# Patient Record
Sex: Female | Born: 1980 | Race: White | Marital: Married | Smoking: Former smoker
Health system: Southern US, Community
[De-identification: ages and names within clinical notes are randomized; demographics above are authoritative.]

## PROBLEM LIST (undated history)

## (undated) ENCOUNTER — Inpatient Hospital Stay (HOSPITAL_COMMUNITY): Payer: Self-pay

## (undated) DIAGNOSIS — Z3480 Encounter for supervision of other normal pregnancy, unspecified trimester: Secondary | ICD-10-CM

## (undated) HISTORY — DX: Encounter for supervision of other normal pregnancy, unspecified trimester: Z34.80

---

## 2013-12-14 ENCOUNTER — Other Ambulatory Visit: Payer: Self-pay | Admitting: Family Medicine

## 2013-12-14 ENCOUNTER — Ambulatory Visit (INDEPENDENT_AMBULATORY_CARE_PROVIDER_SITE_OTHER): Payer: Commercial Indemnity | Admitting: Family Medicine

## 2013-12-14 ENCOUNTER — Encounter: Payer: Self-pay | Admitting: Family Medicine

## 2013-12-14 VITALS — BP 102/45 | HR 62 | Ht 62.0 in | Wt 118.5 lb

## 2013-12-14 DIAGNOSIS — Z01419 Encounter for gynecological examination (general) (routine) without abnormal findings: Secondary | ICD-10-CM

## 2013-12-14 DIAGNOSIS — Z30432 Encounter for removal of intrauterine contraceptive device: Secondary | ICD-10-CM

## 2013-12-14 DIAGNOSIS — Z124 Encounter for screening for malignant neoplasm of cervix: Secondary | ICD-10-CM

## 2013-12-14 NOTE — Progress Notes (Signed)
  Subjective:     Theresa Coleman is a 33 y.o. female and is here for a comprehensive physical exam. The patient reports no problems. Has had IUD x 5 years.  Wants it removed.  Interested in becoming pregnant.  History   Social History  . Marital Status: Married    Spouse Name: N/A    Number of Children: N/A  . Years of Education: N/A   Occupational History  . Not on file.   Social History Main Topics  . Smoking status: Former Smoker    Quit date: 10/07/2010  . Smokeless tobacco: Never Used  . Alcohol Use: Yes     Comment: social  . Drug Use: No  . Sexual Activity: Yes    Birth Control/ Protection: IUD   Other Topics Concern  . Not on file   Social History Narrative  . No narrative on file   No health maintenance topics applied.   The following portions of the patient's history were reviewed and updated as appropriate: allergies, current medications, past family history, past medical history, past social history, past surgical history and problem list. Review of Systems A comprehensive review of systems was negative.   Objective:    BP 102/45  Pulse 62  Ht 5\' 2"  (1.575 m)  Wt 118 lb 8 oz (53.751 kg)  BMI 21.67 kg/m2 General appearance: alert, cooperative and appears stated age Head: Normocephalic, without obvious abnormality, atraumatic Neck: no adenopathy, supple, symmetrical, trachea midline and thyroid not enlarged, symmetric, no tenderness/mass/nodules Lungs: clear to auscultation bilaterally Breasts: normal appearance, no masses or tenderness Heart: regular rate and rhythm, S1, S2 normal, no murmur, click, rub or gallop Abdomen: soft, non-tender; bowel sounds normal; no masses,  no organomegaly Pelvic: cervix normal in appearance, external genitalia normal, no adnexal masses or tenderness, no cervical motion tenderness, uterus normal size, shape, and consistency and vagina normal without discharge Extremities: extremities normal, atraumatic, no cyanosis or  edema Pulses: 2+ and symmetric Skin: Skin color, texture, turgor normal. No rashes or lesions Lymph nodes: Cervical, supraclavicular, and axillary nodes normal. Neurologic: Grossly normal    Assessment:    Healthy female exam. Preconception counseling.     Plan:  Pap smear Annual blood work and flu through work. Begin PNV's.   See After Visit Summary for Counseling Recommendations

## 2013-12-14 NOTE — Patient Instructions (Addendum)
Preparing for Pregnancy Preparing for pregnancy (preconceptual care) by getting counseling and information from your caregiver before getting pregnant is a good idea. It will help you and your baby have a better chance to have a healthy, safe pregnancy and delivery of your baby. Make an appointment with your caregiver to talk about your health, medical, and family history and how to prepare yourself before getting pregnant. Your caregiver will do a complete physical exam and a Pap test. They will want to know:  About you, your spouse or partner, and your family's medical and genetic history.  If you are eating a balanced diet and drinking enough fluids.  What vitamins and mineral supplements you are taking. This includes taking folic acid before getting pregnant to help prevent birth defects.  What medications you are taking including prescription, over-the-counter and herbal medications.  If there is any substance abuse like alcohol, smoking, and illegal drugs.  If there is any mental or physical domestic violence.  If there is any risk of sexually transmitted disease between you and your partner.  What immunizations and vaccinations you have had and what you may need before getting pregnant.  If you should get tested for HIV infection.  If there is any exposure to chemical or toxic substances at home or work.  If there are medical problems you have that need to be treated and kept under control before getting pregnant such as diabetes, high blood pressure or others.  If there were any past surgeries, pregnancies and problems with them.  What your current weight is and to set a goal as to how much weight you should gain while pregnant. Also, they will check if you should lose or gain weight before getting pregnant.  What is your exercise routine and what it is safe when you are pregnant.  If there are any physical disabilities that need to be addressed.  About spacing your  pregnancies when there are other children.  If there is a financial problem that may affect you having a child. After talking about the above points with your caregiver, your caregiver will give you advice on how to help treat and work with you on solving any issues, if necessary, before getting pregnant. The goal is to have a healthy and safe pregnancy for you and your baby. You should keep an accurate record of your menstrual periods because it will help in determining your due date. Immunizations that you should have before getting pregnant:   Regular measles, German measles (rubella) and mumps.  Tetanus and diphtheria.  Chickenpox, if not immune.  Herpes zoster (Varicella) if not immune.  Human papilloma virus vaccine (HPV) between the age of 9 and 26 years old.  Hepatitis A vaccine.  Hepatitis B vaccine.  Influenza vaccine.  Pneumococcal vaccine (pneumonia). You should avoid getting pregnant for one month after getting vaccinated with a live virus vaccine such as German measles (rubella) which is in the MMR (Measles, Mumps and Rubella) vaccine. Other immunizations may be necessary depending on where you live, such as malaria. Ask your caregiver if any other immunizations are needed for you. HOME CARE INSTRUCTIONS   Follow the advice of your caregiver.  Before getting pregnant:  Begin taking vitamins, supplements, and 0.4 milligrams folic acid daily.  Get your immunizations up-to-date.  Get help from a nutrition counselor if you do not understand what a balanced diet is, need help with a special medical diet or if you need help to lose or gain weight.    Begin exercising.  Stop smoking, taking illegal drugs, and drinking alcoholic beverages.  Get counseling if there is and type of domestic violence.  Get checked for sexually transmitted diseases including HIV.  Get any medical problems under control (diabetes, high blood pressure, convulsions, asthma or  others).  Resolve any financial concerns or create a plan to do so.  Be sure you and your spouse or partner are ready to have a baby.  Keep an accurate record of your menstrual periods. Document Released: 09/05/2008 Document Revised: 07/14/2013 Document Reviewed: 09/05/2008 [facility] Patient Information 2014 [place]. Preventive Care for Adults, Female A healthy lifestyle and preventive care can promote health and wellness. Preventive health guidelines for women include the following key practices.  A routine yearly physical is a good way to check with your health care provider about your health and preventive screening. It is a chance to share any concerns and updates on your health and to receive a thorough exam.  Visit your dentist for a routine exam and preventive care every 6 months. Brush your teeth twice a day and floss once a day. Good oral hygiene prevents tooth decay and gum disease.  The frequency of eye exams is based on your age, health, family medical history, use of contact lenses, and other factors. Follow your health care provider's recommendations for frequency of eye exams.  Eat a healthy diet. Foods like vegetables, fruits, whole grains, low-fat dairy products, and lean protein foods contain the nutrients you need without too many calories. Decrease your intake of foods high in solid fats, added sugars, and salt. Eat the right amount of calories for you.Get information about a proper diet from your health care provider, if necessary.  Regular physical exercise is one of the most important things you can do for your health. Most adults should get at least 150 minutes of moderate-intensity exercise (any activity that increases your heart rate and causes you to sweat) each week. In addition, most adults need muscle-strengthening exercises on 2 or more days a week.  Maintain a healthy weight. The body mass index (BMI) is a screening tool to identify possible weight  problems. It provides an estimate of body fat based on height and weight. Your health care provider can find your BMI, and can help you achieve or maintain a healthy weight.For adults 20 years and older:  A BMI below 18.5 is considered underweight.  A BMI of 18.5 to 24.9 is normal.  A BMI of 25 to 29.9 is considered overweight.  A BMI of 30 and above is considered obese.  Maintain normal blood lipids and cholesterol levels by exercising and minimizing your intake of saturated fat. Eat a balanced diet with plenty of fruit and vegetables. Blood tests for lipids and cholesterol should begin at age 66 and be repeated every 5 years. If your lipid or cholesterol levels are high, you are over 50, or you are at high risk for heart disease, you may need your cholesterol levels checked more frequently.Ongoing high lipid and cholesterol levels should be treated with medicines if diet and exercise are not working.  If you smoke, find out from your health care provider how to quit. If you do not use tobacco, do not start.  Lung cancer screening is recommended for adults aged 31 80 years who are at high risk for developing lung cancer because of a history of smoking. A yearly low-dose CT scan of the lungs is recommended for people who have at least a 30-pack-year  history of smoking and are a current smoker or have quit within the past 15 years. A pack year of smoking is smoking an average of 1 pack of cigarettes a day for 1 year (for example: 1 pack a day for 30 years or 2 packs a day for 15 years). Yearly screening should continue until the smoker has stopped smoking for at least 15 years. Yearly screening should be stopped for people who develop a health problem that would prevent them from having lung cancer treatment.  If you are pregnant, do not drink alcohol. If you are breastfeeding, be very cautious about drinking alcohol. If you are not pregnant and choose to drink alcohol, do not have more than 1 drink  per day. One drink is considered to be 12 ounces (355 mL) of beer, 5 ounces (148 mL) of wine, or 1.5 ounces (44 mL) of liquor.  Avoid use of street drugs. Do not share needles with anyone. Ask for help if you need support or instructions about stopping the use of drugs.  High blood pressure causes heart disease and increases the risk of stroke. Your blood pressure should be checked at least every 1 to 2 years. Ongoing high blood pressure should be treated with medicines if weight loss and exercise do not work.  If you are 41 33 years old, ask your health care provider if you should take aspirin to prevent strokes.  Diabetes screening involves taking a blood sample to check your fasting blood sugar level. This should be done once every 3 years, after age 96, if you are within normal weight and without risk factors for diabetes. Testing should be considered at a younger age or be carried out more frequently if you are overweight and have at least 1 risk factor for diabetes.  Breast cancer screening is essential preventive care for women. You should practice "breast self-awareness." This means understanding the normal appearance and feel of your breasts and may include breast self-examination. Any changes detected, no matter how small, should be reported to a health care provider. Women in their 49s and 30s should have a clinical breast exam (CBE) by a health care provider as part of a regular health exam every 1 to 3 years. After age 55, women should have a CBE every year. Starting at age 60, women should consider having a mammogram (breast X-ray test) every year. Women who have a family history of breast cancer should talk to their health care provider about genetic screening. Women at a high risk of breast cancer should talk to their health care providers about having an MRI and a mammogram every year.  Breast cancer gene (BRCA)-related cancer risk assessment is recommended for women who have family  members with BRCA-related cancers. BRCA-related cancers include breast, ovarian, tubal, and peritoneal cancers. Having family members with these cancers may be associated with an increased risk for harmful changes (mutations) in the breast cancer genes BRCA1 and BRCA2. Results of the assessment will determine the need for genetic counseling and BRCA1 and BRCA2 testing.  The Pap test is a screening test for cervical cancer. A Pap test can show cell changes on the cervix that might become cervical cancer if left untreated. A Pap test is a procedure in which cells are obtained and examined from the lower end of the uterus (cervix).  Women should have a Pap test starting at age 76.  Between ages 11 and 35, Pap tests should be repeated every 2 years.  Beginning at  age 70, you should have a Pap test every 3 years as long as the past 3 Pap tests have been normal.  Some women have medical problems that increase the chance of getting cervical cancer. Talk to your health care provider about these problems. It is especially important to talk to your health care provider if a new problem develops soon after your last Pap test. In these cases, your health care provider may recommend more frequent screening and Pap tests.  The above recommendations are the same for women who have or have not gotten the vaccine for human papillomavirus (HPV).  If you had a hysterectomy for a problem that was not cancer or a condition that could lead to cancer, then you no longer need Pap tests. Even if you no longer need a Pap test, a regular exam is a good idea to make sure no other problems are starting.  If you are between ages 52 and 26 years, and you have had normal Pap tests going back 10 years, you no longer need Pap tests. Even if you no longer need a Pap test, a regular exam is a good idea to make sure no other problems are starting.  If you have had past treatment for cervical cancer or a condition that could lead to  cancer, you need Pap tests and screening for cancer for at least 20 years after your treatment.  If Pap tests have been discontinued, risk factors (such as a new sexual partner) need to be reassessed to determine if screening should be resumed.  The HPV test is an additional test that may be used for cervical cancer screening. The HPV test looks for the virus that can cause the cell changes on the cervix. The cells collected during the Pap test can be tested for HPV. The HPV test could be used to screen women aged 71 years and older, and should be used in women of any age who have unclear Pap test results. After the age of 69, women should have HPV testing at the same frequency as a Pap test.  Colorectal cancer can be detected and often prevented. Most routine colorectal cancer screening begins at the age of 35 years and continues through age 69 years. However, your health care provider may recommend screening at an earlier age if you have risk factors for colon cancer. On a yearly basis, your health care provider may provide home test kits to check for hidden blood in the stool. Use of a small camera at the end of a tube, to directly examine the colon (sigmoidoscopy or colonoscopy), can detect the earliest forms of colorectal cancer. Talk to your health care provider about this at age 69, when routine screening begins. Direct exam of the colon should be repeated every 5 10 years through age 64 years, unless early forms of pre-cancerous polyps or small growths are found.  People who are at an increased risk for hepatitis B should be screened for this virus. You are considered at high risk for hepatitis B if:  You were born in a country where hepatitis B occurs often. Talk with your health care provider about which countries are considered high risk.  Your parents were born in a high-risk country and you have not received a shot to protect against hepatitis B (hepatitis B vaccine).  You have HIV or  AIDS.  You use needles to inject street drugs.  You live with, or have sex with, someone who has Hepatitis B.  You get hemodialysis treatment.  You take certain medicines for conditions like cancer, organ transplantation, and autoimmune conditions.  Hepatitis C blood testing is recommended for all people born from [birth year] through 1965 and any individual with known risks for hepatitis C.  Practice safe sex. Use condoms and avoid high-risk sexual practices to reduce the spread of sexually transmitted infections (STIs). STIs include gonorrhea, chlamydia, syphilis, trichomonas, herpes, HPV, and human immunodeficiency virus (HIV). Herpes, HIV, and HPV are viral illnesses that have no cure. They can result in disability, cancer, and death. Sexually active women aged 59 years and younger should be checked for chlamydia. Older women with new or multiple partners should also be tested for chlamydia. Testing for other STIs is recommended if you are sexually active and at increased risk.  Osteoporosis is a disease in which the bones lose minerals and strength with aging. This can result in serious bone fractures or breaks. The risk of osteoporosis can be identified using a bone density scan. Women ages 60 years and over and women at risk for fractures or osteoporosis should discuss screening with their health care providers. Ask your health care provider whether you should take a calcium supplement or vitamin D to reduce the rate of osteoporosis.  Menopause can be associated with physical symptoms and risks. Hormone replacement therapy is available to decrease symptoms and risks. You should talk to your health care provider about whether hormone replacement therapy is right for you.  Use sunscreen. Apply sunscreen liberally and repeatedly throughout the day. You should seek shade when your shadow is shorter than you. Protect yourself by wearing long sleeves, pants, a wide-brimmed hat, and sunglasses year round,  whenever you are outdoors.  Once a month, do a whole body skin exam, using a mirror to look at the skin on your back. Tell your health care provider of new moles, moles that have irregular borders, moles that are larger than a pencil eraser, or moles that have changed in shape or color.  Stay current with required vaccines (immunizations).  Influenza vaccine. All adults should be immunized every year.  Tetanus, diphtheria, and acellular pertussis (Td, Tdap) vaccine. Pregnant women should receive 1 dose of Tdap vaccine during each pregnancy. The dose should be obtained regardless of the length of time since the last dose. Immunization is preferred during the 27th 36th week of gestation. An adult who has not previously received Tdap or who does not know her vaccine status should receive 1 dose of Tdap. This initial dose should be followed by tetanus and diphtheria toxoids (Td) booster doses every 10 years. Adults with an unknown or incomplete history of completing a 3-dose immunization series with Td-containing vaccines should begin or complete a primary immunization series including a Tdap dose. Adults should receive a Td booster every 10 years.  Varicella vaccine. An adult without evidence of immunity to varicella should receive 2 doses or a second dose if she has previously received 1 dose. Pregnant females who do not have evidence of immunity should receive the first dose after pregnancy. This first dose should be obtained before leaving the health care facility. The second dose should be obtained 4 8 weeks after the first dose.  Human papillomavirus (HPV) vaccine. Females aged 71 26 years who have not received the vaccine previously should obtain the 3-dose series. The vaccine is not recommended for use in pregnant females. However, pregnancy testing is not needed before receiving a dose. If a female is found to be pregnant after  receiving a dose, no treatment is needed. In that case, the remaining  doses should be delayed until after the pregnancy. Immunization is recommended for any person with an immunocompromised condition through the age of 4 years if she did not get any or all doses earlier. During the 3-dose series, the second dose should be obtained 4 8 weeks after the first dose. The third dose should be obtained 24 weeks after the first dose and 16 weeks after the second dose.  Zoster vaccine. One dose is recommended for adults aged 42 years or older unless certain conditions are present.  Measles, mumps, and rubella (MMR) vaccine. Adults born before [birth year] generally are considered immune to measles and mumps. Adults born in [birth year] or later should have 1 or more doses of MMR vaccine unless there is a contraindication to the vaccine or there is laboratory evidence of immunity to each of the three diseases. A routine second dose of MMR vaccine should be obtained at least 28 days after the first dose for students attending postsecondary schools, health care workers, or international travelers. People who received inactivated measles vaccine or an unknown type of measles vaccine during 1963 1967 should receive 2 doses of MMR vaccine. People who received inactivated mumps vaccine or an unknown type of mumps vaccine before 1979 and are at high risk for mumps infection should consider immunization with 2 doses of MMR vaccine. For females of childbearing age, rubella immunity should be determined. If there is no evidence of immunity, females who are not pregnant should be vaccinated. If there is no evidence of immunity, females who are pregnant should delay immunization until after pregnancy. Unvaccinated health care workers born before [birth year] who lack laboratory evidence of measles, mumps, or rubella immunity or laboratory confirmation of disease should consider measles and mumps immunization with 2 doses of MMR vaccine or rubella immunization with 1 dose of MMR vaccine.  Pneumococcal 13-valent conjugate  (PCV13) vaccine. When indicated, a person who is uncertain of her immunization history and has no record of immunization should receive the PCV13 vaccine. An adult aged 51 years or older who has certain medical conditions and has not been previously immunized should receive 1 dose of PCV13 vaccine. This PCV13 should be followed with a dose of pneumococcal polysaccharide (PPSV23) vaccine. The PPSV23 vaccine dose should be obtained at least 8 weeks after the dose of PCV13 vaccine. An adult aged 5 years or older who has certain medical conditions and previously received 1 or more doses of PPSV23 vaccine should receive 1 dose of PCV13. The PCV13 vaccine dose should be obtained 1 or more years after the last PPSV23 vaccine dose.  Pneumococcal polysaccharide (PPSV23) vaccine. When PCV13 is also indicated, PCV13 should be obtained first. All adults aged 51 years and older should be immunized. An adult younger than age 88 years who has certain medical conditions should be immunized. Any person who resides in a nursing home or long-term care facility should be immunized. An adult smoker should be immunized. People with an immunocompromised condition and certain other conditions should receive both PCV13 and PPSV23 vaccines. People with human immunodeficiency virus (HIV) infection should be immunized as soon as possible after diagnosis. Immunization during chemotherapy or radiation therapy should be avoided. Routine use of PPSV23 vaccine is not recommended for American Indians, [place]atives, or people younger than 65 years unless there are medical conditions that require PPSV23 vaccine. When indicated, people who have unknown immunization and have no record of immunization should receive  PPSV23 vaccine. One-time revaccination 5 years after the first dose of PPSV23 is recommended for people aged 91 64 years who have chronic kidney failure, nephrotic syndrome, asplenia, or immunocompromised conditions. People who received  1 2 doses of PPSV23 before age 74 years should receive another dose of PPSV23 vaccine at age 36 years or later if at least 5 years have passed since the previous dose. Doses of PPSV23 are not needed for people immunized with PPSV23 at or after age 73 years.  Meningococcal vaccine. Adults with asplenia or persistent complement component deficiencies should receive 2 doses of quadrivalent meningococcal conjugate (MenACWY-D) vaccine. The doses should be obtained at least 2 months apart. Microbiologists working with certain meningococcal bacteria, [employer] recruits, people at risk during an outbreak, and people who travel to or live in countries with a high rate of meningitis should be immunized. A first-year college student up through age 94 years who is living in a residence hall should receive a dose if she did not receive a dose on or after her 16th birthday. Adults who have certain high-risk conditions should receive one or more doses of vaccine.  Hepatitis A vaccine. Adults who wish to be protected from this disease, have certain high-risk conditions, work with hepatitis A-infected animals, work in hepatitis A research labs, or travel to or work in countries with a high rate of hepatitis A should be immunized. Adults who were previously unvaccinated and who anticipate close contact with an international adoptee during the first 60 days after arrival in the [place] from a country with a high rate of hepatitis A should be immunized.  Hepatitis B vaccine. Adults who wish to be protected from this disease, have certain high-risk conditions, may be exposed to blood or other infectious body fluids, are household contacts or sex partners of hepatitis B positive people, are clients or workers in certain care facilities, or travel to or work in countries with a high rate of hepatitis B should be immunized.  Haemophilus influenzae type b (Hib) vaccine. A previously unvaccinated person with asplenia or sickle  cell disease or having a scheduled splenectomy should receive 1 dose of Hib vaccine. Regardless of previous immunization, a recipient of a hematopoietic stem cell transplant should receive a 3-dose series 6 12 months after her successful transplant. Hib vaccine is not recommended for adults with HIV infection. Preventive Services / Frequency Ages 23 to 39years  Blood pressure check.** / Every 1 to 2 years.  Lipid and cholesterol check.** / Every 5 years beginning at age 84.  Clinical breast exam.** / Every 3 years for women in their 4s and 42s.  BRCA-related cancer risk assessment.** / For women who have family members with a BRCA-related cancer (breast, ovarian, tubal, or peritoneal cancers).  Pap test.** / Every 2 years from ages 86 through 28. Every 3 years starting at age 35 through age 6 or 72 with a history of 3 consecutive normal Pap tests.  HPV screening.** / Every 3 years from ages 3 through ages 55 to 69 with a history of 3 consecutive normal Pap tests.  Hepatitis C blood test.** / For any individual with known risks for hepatitis C.  Skin self-exam. / Monthly.  Influenza vaccine. / Every year.  Tetanus, diphtheria, and acellular pertussis (Tdap, Td) vaccine.** / Consult your health care provider. Pregnant women should receive 1 dose of Tdap vaccine during each pregnancy. 1 dose of Td every 10 years.  Varicella vaccine.** / Consult your health care provider. Pregnant  females who do not have evidence of immunity should receive the first dose after pregnancy.  HPV vaccine. / 3 doses over 6 months, if 50 and younger. The vaccine is not recommended for use in pregnant females. However, pregnancy testing is not needed before receiving a dose.  Measles, mumps, rubella (MMR) vaccine.** / You need at least 1 dose of MMR if you were born in 1957 or later. You may also need a 2nd dose. For females of childbearing age, rubella immunity should be determined. If there is no evidence of  immunity, females who are not pregnant should be vaccinated. If there is no evidence of immunity, females who are pregnant should delay immunization until after pregnancy.  Pneumococcal 13-valent conjugate (PCV13) vaccine.** / Consult your health care provider.  Pneumococcal polysaccharide (PPSV23) vaccine.** / 1 to 2 doses if you smoke cigarettes or if you have certain conditions.  Meningococcal vaccine.** / 1 dose if you are age 86 to 41 years and a Market researcher living in a residence hall, or have one of several medical conditions, you need to get vaccinated against meningococcal disease. You may also need additional booster doses.  Hepatitis A vaccine.** / Consult your health care provider.  Hepatitis B vaccine.** / Consult your health care provider.  Haemophilus influenzae type b (Hib) vaccine.** / Consult your health care provider. Ages 8 to 64years  Blood pressure check.** / Every 1 to 2 years.  Lipid and cholesterol check.** / Every 5 years beginning at age 29 years.  Lung cancer screening. / Every year if you are aged 73 80 years and have a 30-pack-year history of smoking and currently smoke or have quit within the past 15 years. Yearly screening is stopped once you have quit smoking for at least 15 years or develop a health problem that would prevent you from having lung cancer treatment.  Clinical breast exam.** / Every year after age 39 years.  BRCA-related cancer risk assessment.** / For women who have family members with a BRCA-related cancer (breast, ovarian, tubal, or peritoneal cancers).  Mammogram.** / Every year beginning at age 31 years and continuing for as long as you are in good health. Consult with your health care provider.  Pap test.** / Every 3 years starting at age 9 years through age 9 or 30 years with a history of 3 consecutive normal Pap tests.  HPV screening.** / Every 3 years from ages 36 years through ages 20 to 82 years with a history  of 3 consecutive normal Pap tests.  Fecal occult blood test (FOBT) of stool. / Every year beginning at age 81 years and continuing until age 35 years. You may not need to do this test if you get a colonoscopy every 10 years.  Flexible sigmoidoscopy or colonoscopy.** / Every 5 years for a flexible sigmoidoscopy or every 10 years for a colonoscopy beginning at age 52 years and continuing until age 20 years.  Hepatitis C blood test.** / For all people born from [birth year] through 1965 and any individual with known risks for hepatitis C.  Skin self-exam. / Monthly.  Influenza vaccine. / Every year.  Tetanus, diphtheria, and acellular pertussis (Tdap/Td) vaccine.** / Consult your health care provider. Pregnant women should receive 1 dose of Tdap vaccine during each pregnancy. 1 dose of Td every 10 years.  Varicella vaccine.** / Consult your health care provider. Pregnant females who do not have evidence of immunity should receive the first dose after pregnancy.  Zoster vaccine.** / 1  dose for adults aged 31 years or older.  Measles, mumps, rubella (MMR) vaccine.** / You need at least 1 dose of MMR if you were born in 1957 or later. You may also need a 2nd dose. For females of childbearing age, rubella immunity should be determined. If there is no evidence of immunity, females who are not pregnant should be vaccinated. If there is no evidence of immunity, females who are pregnant should delay immunization until after pregnancy.  Pneumococcal 13-valent conjugate (PCV13) vaccine.** / Consult your health care provider.  Pneumococcal polysaccharide (PPSV23) vaccine.** / 1 to 2 doses if you smoke cigarettes or if you have certain conditions.  Meningococcal vaccine.** / Consult your health care provider.  Hepatitis A vaccine.** / Consult your health care provider.  Hepatitis B vaccine.** / Consult your health care provider.  Haemophilus influenzae type b (Hib) vaccine.** / Consult your health care  provider. Ages 32 years and over  Blood pressure check.** / Every 1 to 2 years.  Lipid and cholesterol check.** / Every 5 years beginning at age 63 years.  Lung cancer screening. / Every year if you are aged 17 80 years and have a 30-pack-year history of smoking and currently smoke or have quit within the past 15 years. Yearly screening is stopped once you have quit smoking for at least 15 years or develop a health problem that would prevent you from having lung cancer treatment.  Clinical breast exam.** / Every year after age 6 years.  BRCA-related cancer risk assessment.** / For women who have family members with a BRCA-related cancer (breast, ovarian, tubal, or peritoneal cancers).  Mammogram.** / Every year beginning at age 36 years and continuing for as long as you are in good health. Consult with your health care provider.  Pap test.** / Every 3 years starting at age 41 years through age 24 or 73 years with 3 consecutive normal Pap tests. Testing can be stopped between 65 and 70 years with 3 consecutive normal Pap tests and no abnormal Pap or HPV tests in the past 10 years.  HPV screening.** / Every 3 years from ages 35 years through ages 42 or 34 years with a history of 3 consecutive normal Pap tests. Testing can be stopped between 65 and 70 years with 3 consecutive normal Pap tests and no abnormal Pap or HPV tests in the past 10 years.  Fecal occult blood test (FOBT) of stool. / Every year beginning at age 33 years and continuing until age 53 years. You may not need to do this test if you get a colonoscopy every 10 years.  Flexible sigmoidoscopy or colonoscopy.** / Every 5 years for a flexible sigmoidoscopy or every 10 years for a colonoscopy beginning at age 19 years and continuing until age 63 years.  Hepatitis C blood test.** / For all people born from [birth year] through 1965 and any individual with known risks for hepatitis C.  Osteoporosis screening.** / A one-time screening for  women ages 77 years and over and women at risk for fractures or osteoporosis.  Skin self-exam. / Monthly.  Influenza vaccine. / Every year.  Tetanus, diphtheria, and acellular pertussis (Tdap/Td) vaccine.** / 1 dose of Td every 10 years.  Varicella vaccine.** / Consult your health care provider.  Zoster vaccine.** / 1 dose for adults aged 46 years or older.  Pneumococcal 13-valent conjugate (PCV13) vaccine.** / Consult your health care provider.  Pneumococcal polysaccharide (PPSV23) vaccine.** / 1 dose for all adults aged 1 years and older.  Meningococcal vaccine.** / Consult your health care provider.  Hepatitis A vaccine.** / Consult your health care provider.  Hepatitis B vaccine.** / Consult your health care provider.  Haemophilus influenzae type b (Hib) vaccine.** / Consult your health care provider. ** Family history and personal history of risk and conditions may change your health care provider's recommendations. Document Released: 11/19/2001 Document Revised: 07/14/2013 Document Reviewed: 02/18/2011 [facility] Patient Information 2014 [place], [place].

## 2013-12-14 NOTE — Progress Notes (Signed)
Here today for IUD removal.  Does not want IUD replaced.  Last pap smear was roughly 2 years ago at [facility] in [place].  Last pap was normal. [employer] employee.

## 2014-04-08 LAB — PAP LB, HPV-H+LR
HPV DNA High Risk: NEGATIVE
HPV DNA Low Risk: NEGATIVE
PAP Smear Comment: 0

## 2014-08-08 ENCOUNTER — Encounter: Payer: Self-pay | Admitting: Family Medicine

## 2015-02-22 ENCOUNTER — Encounter: Payer: Self-pay | Admitting: Certified Nurse Midwife

## 2015-02-22 ENCOUNTER — Ambulatory Visit (INDEPENDENT_AMBULATORY_CARE_PROVIDER_SITE_OTHER): Payer: Commercial Indemnity | Admitting: Certified Nurse Midwife

## 2015-02-22 VITALS — BP 104/69 | HR 83 | Wt 121.6 lb

## 2015-02-22 DIAGNOSIS — Z3480 Encounter for supervision of other normal pregnancy, unspecified trimester: Secondary | ICD-10-CM

## 2015-02-22 DIAGNOSIS — Z98891 History of uterine scar from previous surgery: Secondary | ICD-10-CM

## 2015-02-22 DIAGNOSIS — Z3481 Encounter for supervision of other normal pregnancy, first trimester: Secondary | ICD-10-CM | POA: Diagnosis not present

## 2015-02-22 DIAGNOSIS — Z36 Encounter for antenatal screening of mother: Secondary | ICD-10-CM

## 2015-02-22 HISTORY — DX: Encounter for supervision of other normal pregnancy, unspecified trimester: Z34.80

## 2015-02-22 LAB — OB RESULTS CONSOLE ABO/RH: RH Type: POSITIVE

## 2015-02-22 LAB — OB RESULTS CONSOLE RUBELLA ANTIBODY, IGM: RUBELLA: IMMUNE

## 2015-02-22 LAB — OB RESULTS CONSOLE GC/CHLAMYDIA
CHLAMYDIA, DNA PROBE: NEGATIVE
Gonorrhea: NEGATIVE

## 2015-02-22 LAB — OB RESULTS CONSOLE HIV ANTIBODY (ROUTINE TESTING): HIV: NONREACTIVE

## 2015-02-22 NOTE — Progress Notes (Signed)
New OB.  Bedside ultrasound show CRL measuring 8 weeks 1 day which is consistent with LMP dating by one day.  Positive fetal heart rate seen and heard on ultrasound.  Patient had a normal pap HPV high risk negative in March of last year.  She is tolerating her prenatal vitamins well.

## 2015-02-23 LAB — PRENATAL PROFILE I(LABCORP)
Antibody Screen: NEGATIVE
Basophils Absolute: 0 10*3/uL (ref 0.0–0.2)
Basos: 0 %
EOS (ABSOLUTE): 0.1 10*3/uL (ref 0.0–0.4)
Eos: 1 %
Hematocrit: 36.5 % (ref 34.0–46.6)
Hemoglobin: 12.1 g/dL (ref 11.1–15.9)
Hepatitis B Surface Ag: NEGATIVE
Immature Grans (Abs): 0 10*3/uL (ref 0.0–0.1)
Immature Granulocytes: 0 %
Lymphocytes Absolute: 2.2 10*3/uL (ref 0.7–3.1)
Lymphs: 21 %
MCH: 29.4 pg (ref 26.6–33.0)
MCHC: 33.2 g/dL (ref 31.5–35.7)
MCV: 89 fL (ref 79–97)
Monocytes Absolute: 0.6 10*3/uL (ref 0.1–0.9)
Monocytes: 6 %
Neutrophils Absolute: 7.4 10*3/uL — ABNORMAL HIGH (ref 1.4–7.0)
Neutrophils: 72 %
Platelets: 216 10*3/uL (ref 150–379)
RBC: 4.11 x10E6/uL (ref 3.77–5.28)
RDW: 13.6 % (ref 12.3–15.4)
RPR Ser Ql: NONREACTIVE
Rh Factor: POSITIVE
Rubella Antibodies, IGG: 9.16 index (ref >0.99)
WBC: 10.4 10*3/uL (ref 3.4–10.8)

## 2015-02-23 LAB — HIV ANTIBODY (ROUTINE TESTING W REFLEX): HIV Screen 4th Generation wRfx: NONREACTIVE

## 2015-02-24 LAB — CULTURE, OB URINE

## 2015-02-24 LAB — URINE CULTURE, OB REFLEX

## 2015-03-01 LAB — GC/CHLAMYDIA PROBE AMP
Chlamydia trachomatis, NAA: NEGATIVE
Neisseria gonorrhoeae by PCR: NEGATIVE

## 2015-03-01 LAB — SPECIMEN STATUS REPORT

## 2015-03-22 ENCOUNTER — Encounter: Payer: Self-pay | Admitting: Advanced Practice Midwife

## 2015-03-22 ENCOUNTER — Ambulatory Visit (INDEPENDENT_AMBULATORY_CARE_PROVIDER_SITE_OTHER): Payer: Commercial Indemnity | Admitting: Physician Assistant

## 2015-03-22 VITALS — BP 110/73 | HR 83 | Wt 121.4 lb

## 2015-03-22 DIAGNOSIS — O26851 Spotting complicating pregnancy, first trimester: Secondary | ICD-10-CM | POA: Diagnosis not present

## 2015-03-22 DIAGNOSIS — Z3481 Encounter for supervision of other normal pregnancy, first trimester: Secondary | ICD-10-CM

## 2015-03-22 NOTE — Patient Instructions (Signed)
Prenatal Care  WHAT IS PRENATAL CARE?  Prenatal care means health care during your pregnancy, before your baby is born. It is very important to take care of yourself and your baby during your pregnancy by:   Getting early prenatal care. If you know you are pregnant, or think you might be pregnant, call your health care provider as soon as possible. Schedule a visit for a prenatal exam.  Getting regular prenatal care. Follow your health care provider's schedule for blood and other necessary tests. Do not miss appointments.  Doing everything you can to keep yourself and your baby healthy during your pregnancy.  Getting complete care. Prenatal care should include evaluation of the medical, dietary, educational, psychological, and social needs of you and your significant other. The medical and genetic history of your family and the family of your baby's father should be discussed with your health care provider.  Discussing with your health care provider:  Prescription, over-the-counter, and herbal medicines that you take.  Any history of substance abuse, alcohol use, smoking, and illegal drug use.  Any history of domestic abuse and violence.  Immunizations you have received.  Your nutrition and diet.  The amount of exercise you do.  Any environmental and occupational hazards to which you are exposed.  History of sexually transmitted infections for both you and your partner.  Previous pregnancies you have had. WHY IS PRENATAL CARE SO IMPORTANT?  By regularly seeing your health care provider, you help ensure that problems can be identified early so that they can be treated as soon as possible. Other problems might be prevented. Many studies have shown that early and regular prenatal care is important for the health of mothers and their babies.  HOW CAN I TAKE CARE OF MYSELF WHILE I AM PREGNANT?  Here are ways to take care of yourself and your baby:   Start or continue taking your  multivitamin with 400 micrograms (mcg) of folic acid every day.  Get early and regular prenatal care. It is very important to see a health care provider during your pregnancy. Your health care provider will check at each visit to make sure that you and your baby are healthy. If there are any problems, action can be taken right away to help you and your baby.  Eat a healthy diet that includes:  Fruits.  Vegetables.  Foods low in saturated fat.  Whole grains.  Calcium-rich foods, such as milk, yogurt, and hard cheeses.  Drink 6-8 glasses of liquids a day.  Unless your health care provider tells you not to, try to be physically active for 30 minutes, most days of the week. If you are pressed for time, you can get your activity in through 10-minute segments, three times a day.  Do not smoke, drink alcohol, or use drugs. These can cause long-term damage to your baby. Talk with your health care provider about steps to take to stop smoking. Talk with a member of your faith community, a counselor, a trusted friend, or your health care provider if you are concerned about your alcohol or drug use.  Ask your health care provider before taking any medicine, even over-the-counter medicines. Some medicines are not safe to take during pregnancy.  Get plenty of rest and sleep.  Avoid hot tubs and saunas during pregnancy.  Do not have X-rays taken unless absolutely necessary and with the recommendation of your health care provider. A lead shield can be placed on your abdomen to protect your baby when   X-rays are taken in other parts of your body.  Do not empty the cat litter when you are pregnant. It may contain a parasite that causes an infection called toxoplasmosis, which can cause birth defects. Also, use gloves when working in garden areas used by cats.  Do not eat uncooked or undercooked meats or fish.  Do not eat soft, mold-ripened cheeses (Brie, Camembert, and chevre) or soft, blue-veined  cheese (Danish blue and Roquefort).  Stay away from toxic chemicals like:  Insecticides.  Solvents (some cleaners or paint thinners).  Lead.  Mercury.  Sexual intercourse may continue until the end of the pregnancy, unless you have a medical problem or there is a problem with the pregnancy and your health care provider tells you not to.  Do not wear high-heel shoes, especially during the second half of the pregnancy. You can lose your balance and fall.  Do not take long trips, unless absolutely necessary. Be sure to see your health care provider before going on the trip.  Do not sit in one position for more than 2 hours when on a trip.  Take a copy of your medical records when going on a trip. Know where a hospital is located in the city you are visiting, in case of an emergency.  Most dangerous household products will have pregnancy warnings on their labels. Ask your health care provider about products if you are unsure.  Limit or eliminate your caffeine intake from coffee, tea, sodas, medicines, and chocolate.  Many women continue working through pregnancy. Staying active might help you stay healthier. If you have a question about the safety or the hours you work at your particular job, talk with your health care provider.  Get informed:  Read books.  Watch videos.  Go to childbirth classes for you and your significant other.  Talk with experienced moms.  Ask your health care provider about childbirth education classes for you and your partner. Classes can help you and your partner prepare for the birth of your baby.  Ask about a baby doctor (pediatrician) and methods and pain medicine for labor, delivery, and possible cesarean delivery. HOW OFTEN SHOULD I SEE MY HEALTH CARE PROVIDER DURING PREGNANCY?  Your health care provider will give you a schedule for your prenatal visits. You will have visits more often as you get closer to the end of your pregnancy. An average  pregnancy lasts about 40 weeks.  A typical schedule includes visiting your health care provider:   About once each month during your first 6 months of pregnancy.  Every 2 weeks during the next 2 months.  Weekly in the last month, until the delivery date. Your health care provider will probably want to see you more often if:  You are older than 35 years.  Your pregnancy is high risk because you have certain health problems or problems with the pregnancy, such as:  Diabetes.  High blood pressure.  The baby is not growing on schedule, according to the dates of the pregnancy. Your health care provider will do special tests to make sure you and your baby are not having any serious problems. WHAT HAPPENS DURING PRENATAL VISITS?   At your first prenatal visit, your health care provider will do a physical exam and talk to you about your health history and the health history of your partner and your family. Your health care provider will be able to tell you what date to expect your baby to be born on.  Your   first physical exam will include checks of your blood pressure, measurements of your height and weight, and an exam of your pelvic organs. Your health care provider will do a Pap test if you have not had one recently and will do cultures of your cervix to make sure there is no infection.  At each prenatal visit, there will be tests of your blood, urine, blood pressure, weight, and the progress of the baby will be checked.  At your later prenatal visits, your health care provider will check how you are doing and how your baby is developing. You may have a number of tests done as your pregnancy progresses.  Ultrasound exams are often used to check on your baby's growth and health.  You may have more urine and blood tests, as well as special tests, if needed. These may include amniocentesis to examine fluid in the pregnancy sac, stress tests to check how the baby responds to contractions, or a  biophysical profile to measure your baby's well-being. Your health care provider will explain the tests and why they are necessary.  You should be tested for high blood sugar (gestational diabetes) between the 24th and 28th weeks of your pregnancy.  You should discuss with your health care provider your plans to breastfeed or bottle-feed your baby.  Each visit is also a chance for you to learn about staying healthy during pregnancy and to ask questions. Document Released: 09/26/2003 Document Revised: 09/28/2013 Document Reviewed: 12/08/2013 [employer] Patient Information 2015 [employer]. This information is not intended to replace advice given to you by your health care provider. Make sure you discuss any questions you have with your health care provider.  

## 2015-03-22 NOTE — Progress Notes (Signed)
Patient noticed blood when wiping herself and was worried so she came in a day early to be checked.  Active baby seen on ultrasound with positive fetal heart rate.  She has not noticed any further bleeding or brown discharge.

## 2015-03-22 NOTE — Progress Notes (Signed)
12 weeks.  Concerned about a small amt of blood she noticed upon wiping during the night last night.  She has not had any further concern.  No other complaint.   RTC 4 weeks for OBF.

## 2015-03-23 ENCOUNTER — Encounter: Payer: Commercial Indemnity | Admitting: Obstetrics & Gynecology

## 2015-04-12 NOTE — Progress Notes (Signed)
Subjective:    Theresa Coleman is a G2P1001 4612w2d being seen today for her first obstetrical visit.  Her obstetrical history is non remarkable except for Previous Ceserean Section. Patient does intend to breast feed. Pregnancy history fully reviewed.  Patient reports no complaints.  Filed Vitals:   02/22/15 1324  BP: 104/69  Pulse: 83  Weight: 121 lb 9.6 oz (55.157 kg)    HISTORY: OB History  Gravida Para Term Preterm AB SAB TAB Ectopic Multiple Living  2 1 1       1     # Outcome Date GA Lbr Len/2nd Weight Sex Delivery Anes PTL Lv  2 Current           1 Term 10/18/03 5712w0d  7 lb 12 oz (3.515 kg) M CS-LTranv   Y     Comments: Arrest of dilation     Past Medical History  Diagnosis Date  . Supervision of other normal pregnancy, antepartum 02/22/2015     Clinic  Prenatal Labs Dating  Blood type:    Genetic Screen 1 Screen:    AFP:     Quad:     NIPS: Antibody:  Anatomic US  Rubella:   GTT Early:               Third trimester:  RPR:    Flu vaccine  HBsAg:    TDaP vaccine                                               Rhogam: HIV:    GBS                                              (For PCN allergy, check sensitivities) GBS:  Contraception  Pap: Ba   History reviewed. No pertinent past surgical history. Family History  Problem Relation Age of Onset  . Stroke Paternal Grandmother   . Cancer Maternal Grandmother     Breast -age 34  . Cancer Maternal Grandfather     Brain     Exam    Uterus:     Pelvic Exam:    Perineum: Normal Perineum   Vulva: normal, Bartholin's, Urethra, Skene's normal   Vagina:  normal mucosa   pH:    Cervix: closed   Adnexa: normal adnexa   Bony Pelvis: gynecoid  System: Breast:     Skin: normal coloration and turgor, no rashes    Neurologic: oriented, normal   Extremities: normal strength, tone, and muscle mass   HEENT    Mouth/Teeth mucous membranes moist, pharynx normal without lesions and dental hygiene good   Neck supple and no masses    Cardiovascular: regular rate and rhythm   Respiratory:  appears well, vitals normal, no respiratory distress, acyanotic, normal RR, ear and throat exam is normal, neck free of mass or lymphadenopathy   Abdomen: soft, non-tender; bowel sounds normal; no masses,  no organomegaly   Urinary: urethral meatus normal      Assessment:    Pregnancy: G2P1001 Patient Active Problem List   Diagnosis Date Noted  . Supervision of other normal pregnancy, antepartum 02/22/2015    Previous Ceserean Section    Plan:     Initial labs drawn. Prenatal vitamins.  Problem list reviewed and updated. Genetic Screening discussed First Screen and Quad Screen: undecided.  Ultrasound discussed; fetal survey: requested.  Follow up in 4 weeks. 50% of 30 min visit spent on counseling and coordination of care.     Lawson Fiscal Raeqwon Lux CNM 02/22/2015

## 2015-04-12 NOTE — Patient Instructions (Signed)
First Trimester of Pregnancy The first trimester of pregnancy is from week 1 until the end of week 12 (months 1 through 3). A week after a sperm fertilizes an egg, the egg will implant on the wall of the uterus. This embryo will begin to develop into a baby. Genes from you and your partner are forming the baby. The female genes determine whether the baby is a boy or a girl. At 6-8 weeks, the eyes and face are formed, and the heartbeat can be seen on ultrasound. At the end of 12 weeks, all the baby's organs are formed.  Now that you are pregnant, you will want to do everything you can to have a healthy baby. Two of the most important things are to get good prenatal care and to follow your health care provider's instructions. Prenatal care is all the medical care you receive before the baby's birth. This care will help prevent, find, and treat any problems during the pregnancy and childbirth. BODY CHANGES Your body goes through many changes during pregnancy. The changes vary from woman to woman.   You may gain or lose a couple of pounds at first.  You may feel sick to your stomach (nauseous) and throw up (vomit). If the vomiting is uncontrollable, call your health care provider.  You may tire easily.  You may develop headaches that can be relieved by medicines approved by your health care provider.  You may urinate more often. Painful urination may mean you have a bladder infection.  You may develop heartburn as a result of your pregnancy.  You may develop constipation because certain hormones are causing the muscles that push waste through your intestines to slow down.  You may develop hemorrhoids or swollen, bulging veins (varicose veins).  Your breasts may begin to grow larger and become tender. Your nipples may stick out more, and the tissue that surrounds them (areola) may become darker.  Your gums may bleed and may be sensitive to brushing and flossing.  Dark spots or blotches  (chloasma, mask of pregnancy) may develop on your face. This will likely fade after the baby is born.  Your menstrual periods will stop.  You may have a loss of appetite.  You may develop cravings for certain kinds of food.  You may have changes in your emotions from day to day, such as being excited to be pregnant or being concerned that something may go wrong with the pregnancy and baby.  You may have more vivid and strange dreams.  You may have changes in your hair. These can include thickening of your hair, rapid growth, and changes in texture. Some women also have hair loss during or after pregnancy, or hair that feels dry or thin. Your hair will most likely return to normal after your baby is born. WHAT TO EXPECT AT YOUR PRENATAL VISITS During a routine prenatal visit:  You will be weighed to make sure you and the baby are growing normally.  Your blood pressure will be taken.  Your abdomen will be measured to track your baby's growth.  The fetal heartbeat will be listened to starting around week 10 or 12 of your pregnancy.  Test results from any previous visits will be discussed. Your health care provider may ask you:  How you are feeling.  If you are feeling the baby move.  If you have had any abnormal symptoms, such as leaking fluid, bleeding, severe headaches, or abdominal cramping.  If you have any questions. Other tests   that may be performed during your first trimester include:  Blood tests to find your blood type and to check for the presence of any previous infections. They will also be used to check for low iron levels (anemia) and Rh antibodies. Later in the pregnancy, blood tests for diabetes will be done along with other tests if problems develop.  Urine tests to check for infections, diabetes, or protein in the urine.  An ultrasound to confirm the proper growth and development of the baby.  An amniocentesis to check for possible genetic problems.  Fetal  screens for spina bifida and Down syndrome.  You may need other tests to make sure you and the baby are doing well. HOME CARE INSTRUCTIONS  Medicines  Follow your health care provider's instructions regarding medicine use. Specific medicines may be either safe or unsafe to take during pregnancy.  Take your prenatal vitamins as directed.  If you develop constipation, try taking a stool softener if your health care provider approves. Diet  Eat regular, well-balanced meals. Choose a variety of foods, such as meat or vegetable-based protein, fish, milk and low-fat dairy products, vegetables, fruits, and whole grain breads and cereals. Your health care provider will help you determine the amount of weight gain that is right for you.  Avoid raw meat and uncooked cheese. These carry germs that can cause birth defects in the baby.  Eating four or five small meals rather than three large meals a day may help relieve nausea and vomiting. If you start to feel nauseous, eating a few soda crackers can be helpful. Drinking liquids between meals instead of during meals also seems to help nausea and vomiting.  If you develop constipation, eat more high-fiber foods, such as fresh vegetables or fruit and whole grains. Drink enough fluids to keep your urine clear or pale yellow. Activity and Exercise  Exercise only as directed by your health care provider. Exercising will help you:  Control your weight.  Stay in shape.  Be prepared for labor and delivery.  Experiencing pain or cramping in the lower abdomen or low back is a good sign that you should stop exercising. Check with your health care provider before continuing normal exercises.  Try to avoid standing for long periods of time. Move your legs often if you must stand in one place for a long time.  Avoid heavy lifting.  Wear low-heeled shoes, and practice good posture.  You may continue to have sex unless your health care provider directs you  otherwise. Relief of Pain or Discomfort  Wear a good support bra for breast tenderness.   Take warm sitz baths to soothe any pain or discomfort caused by hemorrhoids. Use hemorrhoid cream if your health care provider approves.   Rest with your legs elevated if you have leg cramps or low back pain.  If you develop varicose veins in your legs, wear support hose. Elevate your feet for 15 minutes, 3-4 times a day. Limit salt in your diet. Prenatal Care  Schedule your prenatal visits by the twelfth week of pregnancy. They are usually scheduled monthly at first, then more often in the last 2 months before delivery.  Write down your questions. Take them to your prenatal visits.  Keep all your prenatal visits as directed by your health care provider. Safety  Wear your seat belt at all times when driving.  Make a list of emergency phone numbers, including numbers for family, friends, the hospital, and police and fire departments. General Tips    Ask your health care provider for a referral to a local prenatal education class. Begin classes no later than at the beginning of month 6 of your pregnancy.  Ask for help if you have counseling or nutritional needs during pregnancy. Your health care provider can offer advice or refer you to specialists for help with various needs.  Do not use hot tubs, steam rooms, or saunas.  Do not douche or use tampons or scented sanitary pads.  Do not cross your legs for long periods of time.  Avoid cat litter boxes and soil used by cats. These carry germs that can cause birth defects in the baby and possibly loss of the fetus by miscarriage or stillbirth.  Avoid all smoking, herbs, alcohol, and medicines not prescribed by your health care provider. Chemicals in these affect the formation and growth of the baby.  Schedule a dentist appointment. At home, brush your teeth with a soft toothbrush and be gentle when you floss. SEEK MEDICAL CARE IF:   You have  dizziness.  You have mild pelvic cramps, pelvic pressure, or nagging pain in the abdominal area.  You have persistent nausea, vomiting, or diarrhea.  You have a bad smelling vaginal discharge.  You have pain with urination.  You notice increased swelling in your face, hands, legs, or ankles. SEEK IMMEDIATE MEDICAL CARE IF:   You have a fever.  You are leaking fluid from your vagina.  You have spotting or bleeding from your vagina.  You have severe abdominal cramping or pain.  You have rapid weight gain or loss.  You vomit blood or material that looks like coffee grounds.  You are exposed to German measles and have never had them.  You are exposed to fifth disease or chickenpox.  You develop a severe headache.  You have shortness of breath.  You have any kind of trauma, such as from a fall or a car accident. Document Released: 09/17/2001 Document Revised: 02/07/2014 Document Reviewed: 08/03/2013 [employer] Patient Information 2015 [employer]. This information is not intended to replace advice given to you by your health care provider. Make sure you discuss any questions you have with your health care provider.  Vaginal Birth After Cesarean Delivery Vaginal birth after cesarean delivery (VBAC) is giving birth vaginally after previously delivering a baby by a cesarean. In the past, if a woman had a cesarean delivery, all births afterward would be done by cesarean delivery. This is no longer true. It can be safe for the mother to try a vaginal delivery after having a cesarean delivery.  It is important to discuss VBAC with your health care provider early in the pregnancy so you can understand the risks, benefits, and options. It will give you time to decide what is best in your particular case. The final decision about whether to have a VBAC or repeat cesarean delivery should be between you and your health care provider. Any changes in your health or your baby's health during  your pregnancy may make it necessary to change your initial decision about VBAC.  WOMEN WHO PLAN TO HAVE A VBAC SHOULD CHECK WITH THEIR HEALTH CARE PROVIDER TO BE SURE THAT:  The previous cesarean delivery was done with a low transverse uterine cut (incision) (not a vertical classical incision).   The birth canal is big enough for the baby.   There were no other operations on the uterus.   An electronic fetal monitor (EFM) will be on at all times during labor.     An operating room will be available and ready in case an emergency cesarean delivery is needed.   A health care provider and surgical nursing staff will be available at all times during labor to be ready to do an emergency delivery cesarean if necessary.   An anesthesiologist will be present in case an emergency cesarean delivery is needed.   The nursery is prepared and has adequate personnel and necessary equipment available to care for the baby in case of an emergency cesarean delivery. BENEFITS OF VBAC  Shorter stay in the hospital.   Avoidance of risks associated with cesarean delivery, such as:  Surgical complications, such as opening of the incision or hernia in the incision.  Injury to other organs.  Fever. This can occur if an infection develops after surgery. It can also occur as a reaction to the medicine given to make you numb during the surgery.  Less blood loss and need for blood transfusions.  Lower risk of blood clots and infection.  Shorter recovery.   Decreased risk for having to remove the uterus (hysterectomy).   Decreased risk for the placenta to completely or partially cover the opening of the uterus (placenta previa) with a future pregnancy.   Decrease risk in future labor and delivery. RISKS OF A VBAC  Tearing (rupture) of the uterus. This is occurs in less than 1% of VBACs. The risk of this happening is higher if:  Steps are taken to begin the labor process (induce labor) or  stimulate or strengthen contractions (augment labor).   Medicine is used to soften (ripen) the cervix.  Having to remove the uterus (hysterectomy) if it ruptures. VBAC SHOULD NOT BE DONE IF:  The previous cesarean delivery was done with a vertical (classical) or T-shaped incision or you do not know what kind of incision was made.   You had a ruptured uterus.   You have had certain types of surgery on your uterus, such as removal of uterine fibroids. Ask your health care provider about other types of surgeries that prevent you from having a VBAC.  You have certain medical or childbirth (obstetrical) problems.   There are problems with the baby.   You have had two previous cesarean deliveries and no vaginal deliveries. OTHER FACTS TO KNOW ABOUT VBAC:  It is safe to have an epidural anesthetic with VBAC.   It is safe to turn the baby from a breech position (attempt an external cephalic version).   It is safe to try a VBAC with twins.   VBAC may not be successful if your baby weights 8.8 lb (4 kg) or more. However, weight predictions are not always accurate and should not be used alone to decide if VBAC is right for you.  There is an increased failure rate if the time between the cesarean delivery and VBAC is less than 19 months.   Your health care provider may advise against a VBAC if you have preeclampsia (high blood pressure, protein in the urine, and swelling of face and extremities).   VBAC is often successful if you previously gave birth vaginally.   VBAC is often successful when the labor starts spontaneously before the due date.   Delivering a baby through a VBAC is similar to having a normal spontaneous vaginal delivery. Document Released: 03/16/2007 Document Revised: 02/07/2014 Document Reviewed: 04/22/2013 [facility] Patient Information 2015 [place], [place]. This information is not intended to replace advice given to you by your health care provider. Make sure  you discuss any  questions you have with your health care provider.  

## 2015-04-19 ENCOUNTER — Encounter: Payer: Self-pay | Admitting: Obstetrics and Gynecology

## 2015-04-19 ENCOUNTER — Ambulatory Visit (INDEPENDENT_AMBULATORY_CARE_PROVIDER_SITE_OTHER): Payer: Commercial Indemnity | Admitting: Obstetrics and Gynecology

## 2015-04-19 VITALS — BP 91/62 | HR 60 | Wt 126.0 lb

## 2015-04-19 DIAGNOSIS — Z3482 Encounter for supervision of other normal pregnancy, second trimester: Secondary | ICD-10-CM

## 2015-04-19 NOTE — Progress Notes (Signed)
Subjective:  Theresa Coleman is a 34 y.o. G2P1001 at 3038w2d being seen today for ongoing prenatal care.  Patient reports no complaints.  Contractions: Not present.  Vag. Bleeding: None. Movement: Absent. Denies leaking of fluid.   The following portions of the patient's history were reviewed and updated as appropriate: allergies, current medications, past family history, past medical history, past social history, past surgical history and problem list.   Objective:   Filed Vitals:   04/19/15 1106  BP: 91/62  Pulse: 60  Weight: 126 lb (57.153 kg)    Fetal Status: Fetal Heart Rate (bpm): 155   Movement: Absent     General:  Alert, oriented and cooperative. Patient is in no acute distress.  Skin: Skin is warm and dry. No rash noted.   Cardiovascular: Normal heart rate noted  Respiratory: Normal respiratory effort, no problems with respiration noted  Abdomen: Soft, gravid, appropriate for gestational age. Pain/Pressure: Absent     Vaginal: Vag. Bleeding: None.    Vag D/C Character: Thin  Cervix: Not evaluated        Extremities: Normal range of motion.  Edema: None  Mental Status: Normal mood and affect. Normal behavior. Normal judgment and thought content.   Urinalysis: Urine Protein: Negative Urine Glucose: Negative  Assessment and Plan:  Pregnancy: G2P1001 at 6638w2d  1. Supervision of other normal pregnancy, antepartum, second trimester Patient is doing well - US OB Comp + 14 Wk; Future - AFP, Quad Screen today   General obstetric precautions including but not limited to vaginal bleeding, contractions, leaking of fluid and fetal movement were reviewed in detail with the patient.  Please refer to After Visit Summary for other counseling recommendations.   Return in about 4 weeks (around 05/17/2015).   Catalina AntiguaPeggy Bryceson Grape, MD

## 2015-04-24 LAB — AFP, QUAD SCREEN
DIA Mom Value: 1.2
DIA Value (EIA): 234.35 pg/mL
DSR (By Age)    1 IN: 360
DSR (SECOND TRIMESTER) 1 IN: 2134
Gestational Age: 16.3 WEEKS
MATERNAL AGE AT EDD: 34.1 a
MSAFP MOM: 1.13
MSAFP: 42.6 ng/mL
MSHCG MOM: 0.63
MSHCG: 26928 m[IU]/mL
Osb Risk: 8106
T18 (By Age): 1:1401 {titer}
TEST RESULTS AFP: NEGATIVE
UE3 VALUE: 0.72 ng/mL
Weight: 126 [lb_av]
uE3 Mom: 0.74

## 2015-05-02 ENCOUNTER — Ambulatory Visit (HOSPITAL_COMMUNITY)
Admission: RE | Admit: 2015-05-02 | Discharge: 2015-05-02 | Disposition: A | Discharge status: Home / Self Care | Payer: Commercial Indemnity | Source: Ambulatory Visit | Attending: Obstetrics and Gynecology | Admitting: Obstetrics and Gynecology

## 2015-05-02 DIAGNOSIS — Z3689 Encounter for other specified antenatal screening: Secondary | ICD-10-CM | POA: Insufficient documentation

## 2015-05-02 DIAGNOSIS — Z3A18 18 weeks gestation of pregnancy: Secondary | ICD-10-CM | POA: Insufficient documentation

## 2015-05-02 DIAGNOSIS — Z3482 Encounter for supervision of other normal pregnancy, second trimester: Secondary | ICD-10-CM | POA: Insufficient documentation

## 2015-05-02 DIAGNOSIS — Z3492 Encounter for supervision of normal pregnancy, unspecified, second trimester: Secondary | ICD-10-CM | POA: Diagnosis present

## 2015-05-02 IMAGING — US US OB COMP +14 WK
1 series · 12 of 28 positions shown · non-contrast
Comparison: none

[Series 1: us ob +14 all · 12 of 104 slices shown]
[im 4/104]
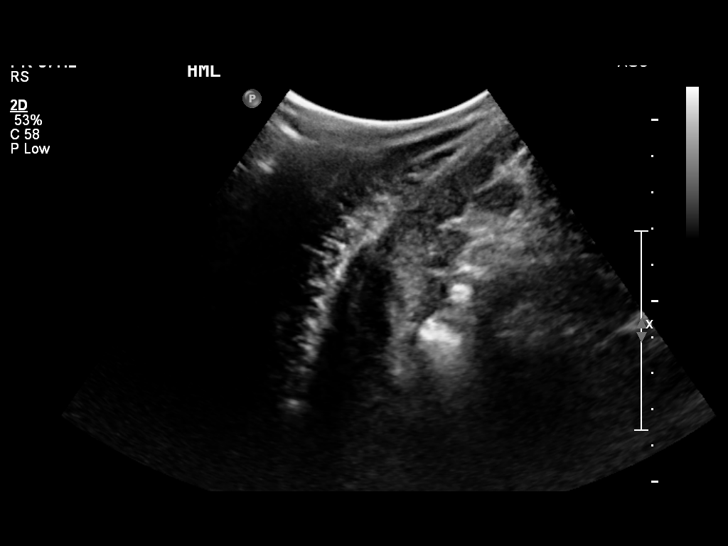
[im 12/104]
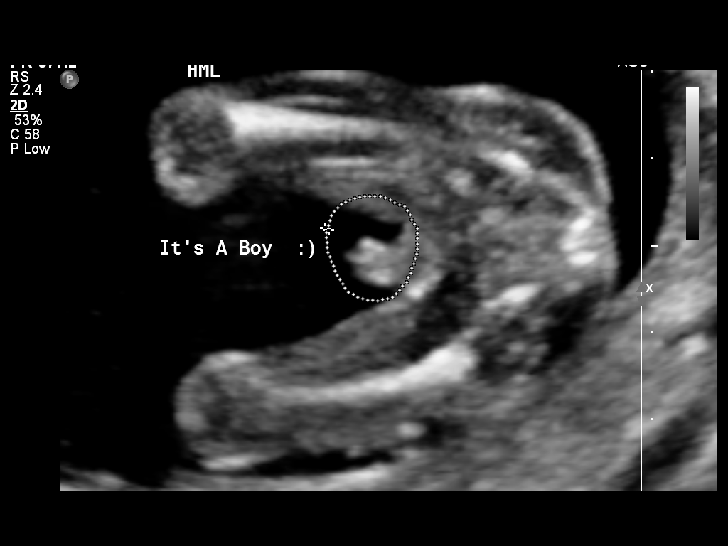
[im 20/104]
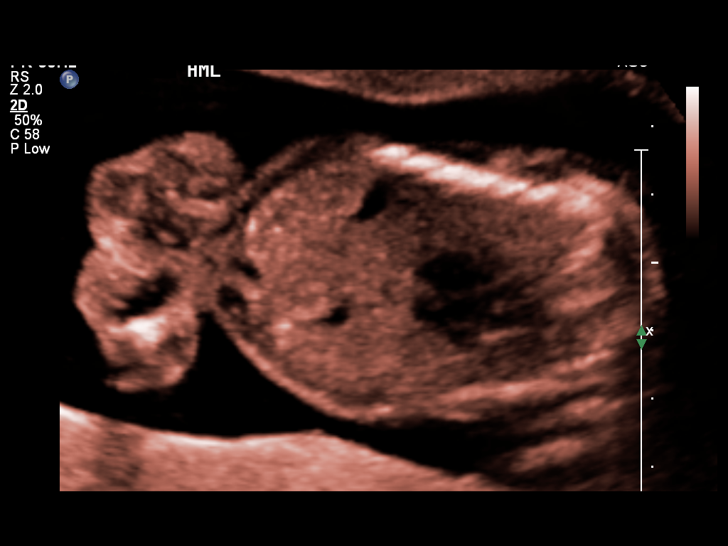
[im 31/104]
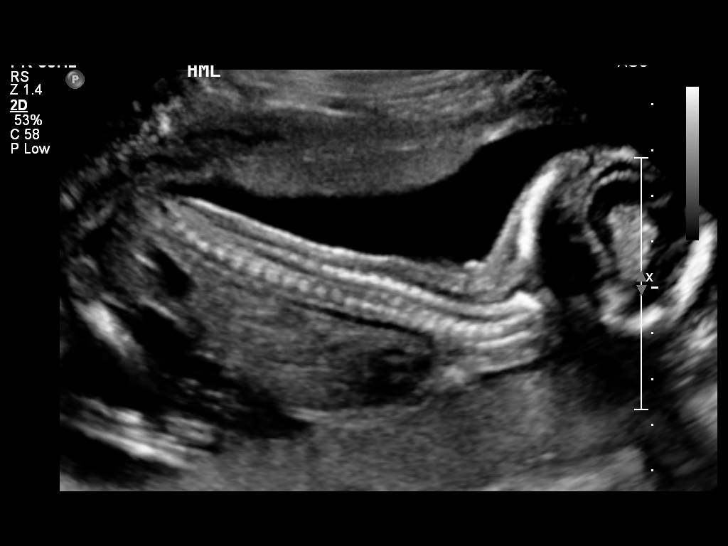
[im 39/104]
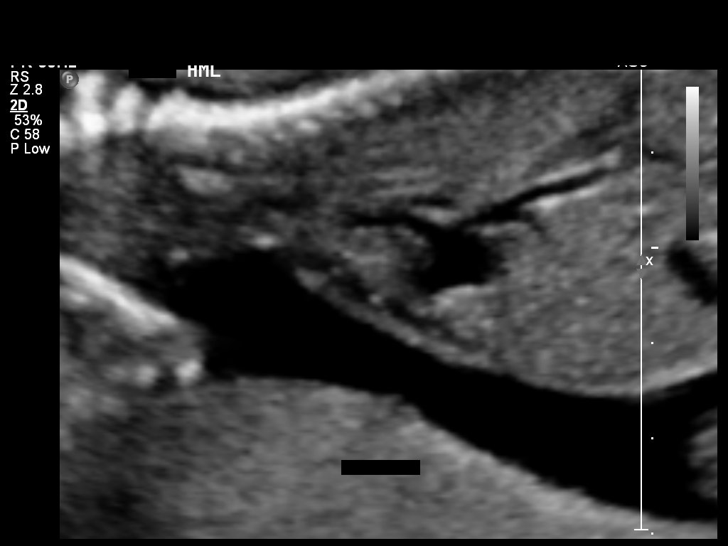
[im 46/104]
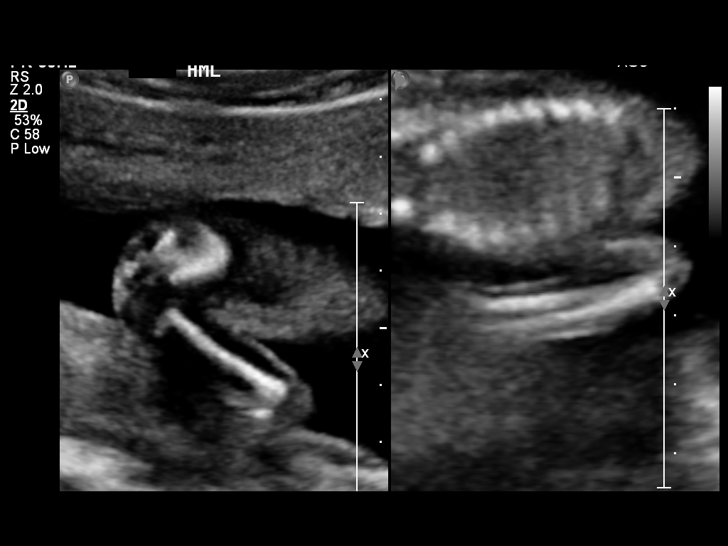
[im 58/104]
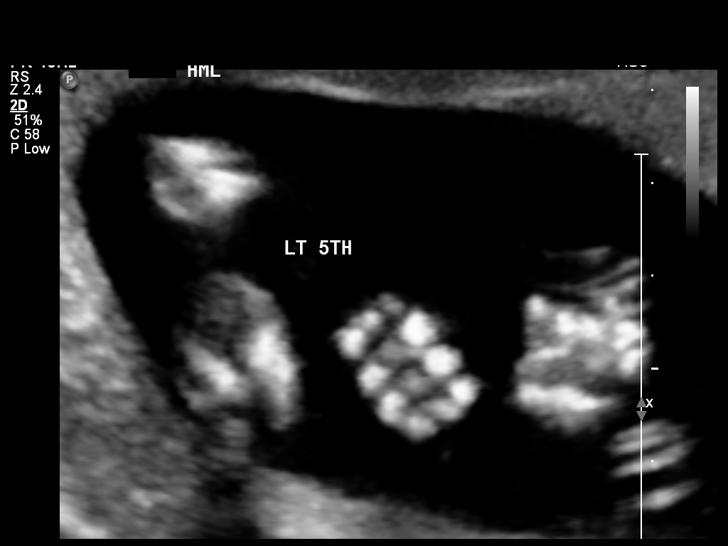
[im 65/104]
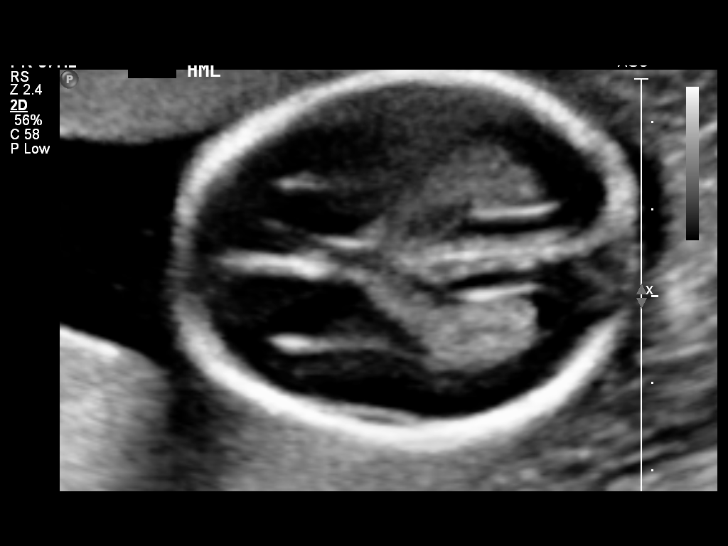
[im 73/104]
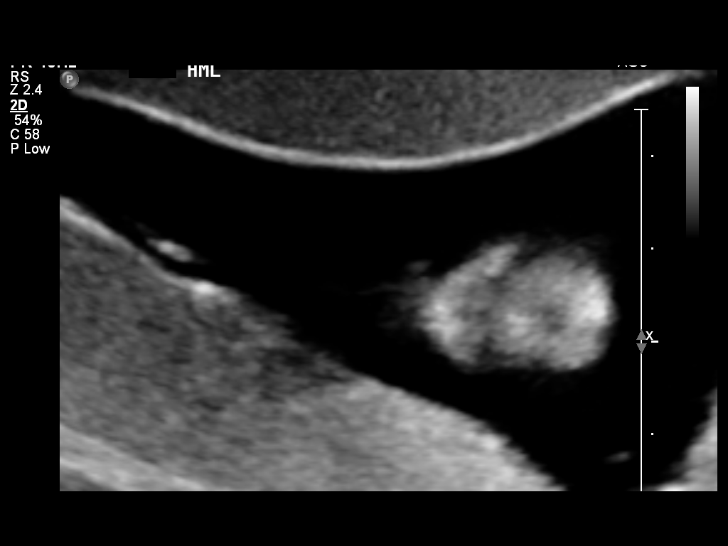
[im 84/104]
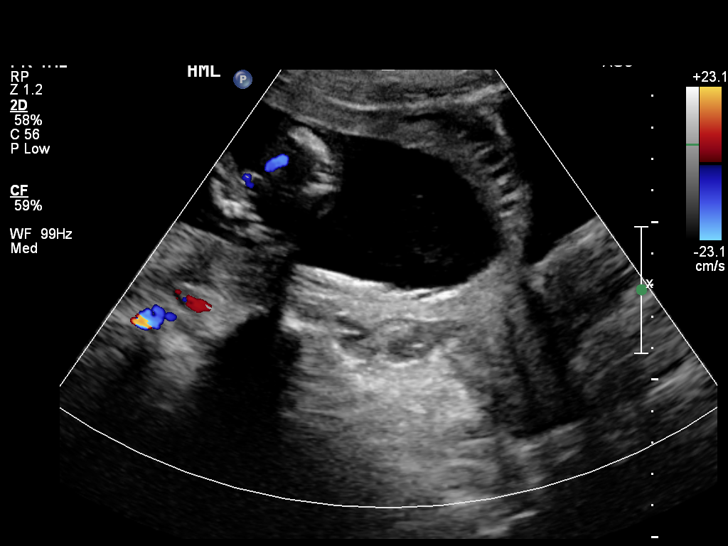
[im 92/104]
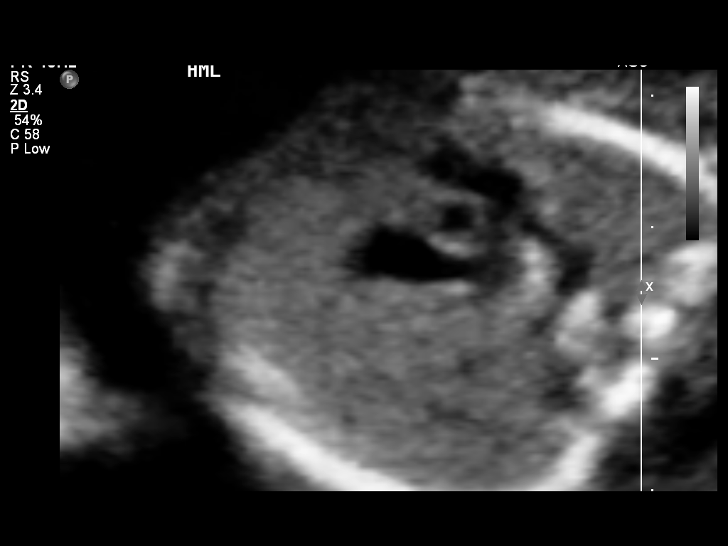
[im 100/104]
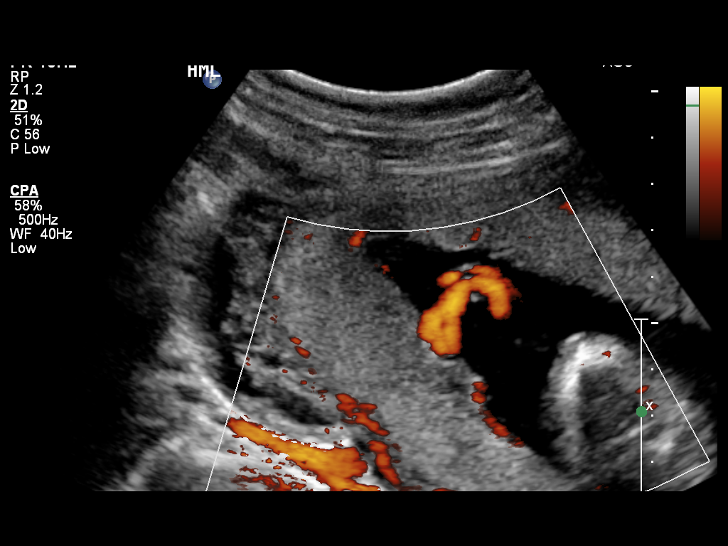

[12 of 28 positions shown; findings below may reference images not displayed]

OBSTETRICS REPORT
(Signed Final [DATE] [DATE])

Name:       BACHOEY                     Visit  [DATE] [DATE]
Date:

Service(s) Provided

US OB COMP + 14 WK                                     76805.1
Indications

18 weeks gestation of pregnancy
Basic anatomic survey                                  [id number]
Fetal Evaluation

Num Of             1
Fetuses:
Fetal Heart        150                          bpm
Rate:
Cardiac Activity:  Observed
Presentation:      Transverse, head to
maternal left
Placenta:          Posterior Fundal, above
cervical os
P. Cord            Visualized, central
Insertion:

Amniotic Fluid
AFI FV:      Subjectively within normal limits
Larg Pckt:    5.10   cm
Biometry

BPD:     41.4   m    G. Age:   18w 4d                 CI:        70.04   70 - 86
m
FL/HC:      16.6   15.8 -
18
HC:     157.8   m    G. Age:   18w 5d        68  %    HC/AC:      1.19   1.07 -
m
AC:     132.7   m    G. Age:   18w 5d        68  %    FL/BPD
m                                     :
FL:      26.2   m    G. Age:   18w 0d        38  %    FL/AC:      19.7   20 - 24
m
HUM:     26.5   m    G. Age:   18w 2d        62  %
m
CER:     19.1   m    G. Age:   18w 4d        63  %
m
NFT:     2.05   m
m

Est.         239   gm    0 lb 8 oz      53   %
FW:
Gestational Age

LMP:           18w 1d        Date:  [DATE]                  EDD:   [DATE]
U/S Today:     18w 3d                                         EDD:   [DATE]
Best:          18w 1d    Det. By:   LMP  ([DATE])           EDD:   [DATE]
Anatomy

Cranium:          Appears normal         Aortic Arch:       Appears normal
Fetal Cavum:      Appears normal         Ductal Arch:       Appears normal
Ventricles:       Appears normal         Diaphragm:         Appears normal
Choroid Plexus:   Appears normal         Stomach:           Appears normal,
left sided
Cerebellum:       Appears normal         Abdomen:           Appears normal
Posterior         Appears normal         Abdominal          Appears nml (cord
Fossa:                                   Wall:              insert, abd wall)
Nuchal Fold:      Appears normal         Cord Vessels:      Appears normal (3
vessel cord)
Face:             Appears normal         Kidneys:           Appear normal
(orbits and profile)
Lips:             Appears normal         Bladder:           Appears normal
Heart:            Appears normal         Spine:             Appears normal
(4CH, axis, and
situs)
RVOT:             Appears normal         Lower              Appears normal
Extremities:
LVOT:             Appears normal         Upper              Appears normal
Extremities:

Other:   Fetus appears to be a male. Heels and 5th digit visualized.
Targeted Anatomy

Fetal Central Nervous System
Lat. Ventricles:  5.8                    Cisterna
Magna:
Cervix Uterus Adnexa

Cervical Length:    4.88      cm

Cervix:       Normal appearance by transabdominal scan.
Uterus:       No abnormality visualized.

Left Ovary:    Not visualized.
Right Ovary:   Not visualized.

Adnexa:     No abnormality visualized. No adnexal mass visualized.
Impression

Single IUP at 18w 1d
Normal fetal anatomic survey
No markers associated with aneuploidy noted
Posterior fundal placenta
Normal amniotic fluid volume
Recommendations

Follow-up ultrasounds as clinically indicated.

## 2015-05-16 ENCOUNTER — Ambulatory Visit (INDEPENDENT_AMBULATORY_CARE_PROVIDER_SITE_OTHER): Payer: Commercial Indemnity | Admitting: Obstetrics & Gynecology

## 2015-05-16 VITALS — BP 108/64 | HR 88 | Wt 132.0 lb

## 2015-05-16 DIAGNOSIS — Z3482 Encounter for supervision of other normal pregnancy, second trimester: Secondary | ICD-10-CM

## 2015-05-16 NOTE — Progress Notes (Signed)
Subjective:  Oddie Kuhlmann is a 34 y.o. G2P1001 at [redacted]w[redacted]d being seen today for ongoing prenatal care.  Patient reports no complaints.  Contractions: Not present.  Vag. Bleeding: None. Movement: Present. Denies leaking of fluid.   The following portions of the patient's history were reviewed and updated as appropriate: allergies, current medications, past family history, past medical history, past social history, past surgical history and problem list.   Objective:   Filed Vitals:   05/16/15 1056  BP: 108/64  Pulse: 88  Weight: 132 lb (59.875 kg)    Fetal Status: Fetal Heart Rate (bpm): 156 Fundal Height: 20 cm Movement: Present     General:  Alert, oriented and cooperative. Patient is in no acute distress.  Skin: Skin is warm and dry. No rash noted.   Cardiovascular: Normal heart rate noted  Respiratory: Normal respiratory effort, no problems with respiration noted  Abdomen: Soft, gravid, appropriate for gestational age. Pain/Pressure: Absent     Pelvic: Vag. Bleeding: None Vag D/C Character: Thin   Cervical exam deferred        Extremities: Normal range of motion.  Edema: None  Mental Status: Normal mood and affect. Normal behavior. Normal judgment and thought content.   Urinalysis:      Assessment and Plan:  Pregnancy: G2P1001 at [redacted]w[redacted]d  1. Supervision of other normal pregnancy, antepartum, second trimester   Preterm labor symptoms and general obstetric precautions including but not limited to vaginal bleeding, contractions, leaking of fluid and fetal movement were reviewed in detail with the patient.  Please refer to After Visit Summary for other counseling recommendations.  Return in about 4 weeks (around 06/13/2015).   Allie Bossier, MD

## 2015-06-06 ENCOUNTER — Telehealth: Payer: Self-pay | Admitting: *Deleted

## 2015-06-06 DIAGNOSIS — O219 Vomiting of pregnancy, unspecified: Secondary | ICD-10-CM

## 2015-06-06 MED ORDER — DOXYLAMINE-PYRIDOXINE 10-10 MG PO TBEC: DELAYED_RELEASE_TABLET | ORAL | Status: DC

## 2015-06-06 NOTE — Telephone Encounter (Signed)
Patient called and needed something sent in for nausea and vomiting.  I have sent in Diclegis to patients pharmacy.

## 2015-06-07 ENCOUNTER — Telehealth: Payer: Self-pay | Admitting: *Deleted

## 2015-06-07 DIAGNOSIS — O219 Vomiting of pregnancy, unspecified: Secondary | ICD-10-CM

## 2015-06-07 MED ORDER — PROMETHAZINE HCL 25 MG PO TABS: 25.0000 mg | ORAL_TABLET | Freq: Four times a day (QID) | ORAL | Status: DC | PRN

## 2015-06-07 NOTE — Telephone Encounter (Signed)
Patient needed something sent in for nausea.  I have sent in Phenergan and made patient aware it may make her sleepy.

## 2015-06-13 ENCOUNTER — Ambulatory Visit (INDEPENDENT_AMBULATORY_CARE_PROVIDER_SITE_OTHER): Payer: Commercial Indemnity | Admitting: Obstetrics & Gynecology

## 2015-06-13 VITALS — BP 91/60 | HR 64 | Wt 140.0 lb

## 2015-06-13 DIAGNOSIS — Z23 Encounter for immunization: Secondary | ICD-10-CM | POA: Diagnosis not present

## 2015-06-13 DIAGNOSIS — Z3482 Encounter for supervision of other normal pregnancy, second trimester: Secondary | ICD-10-CM

## 2015-06-13 NOTE — Progress Notes (Signed)
Subjective:  Theresa Coleman is a 34 y.o.  MW G2P1001 (46 yo son)  at [redacted]w[redacted]d being seen today for ongoing prenatal care.  Patient reports no complaints.  Contractions: Not present.  Vag. Bleeding: None. Movement: Present. Denies leaking of fluid.   The following portions of the patient's history were reviewed and updated as appropriate: allergies, current medications, past family history, past medical history, past social history, past surgical history and problem list.   Objective:   Filed Vitals:   06/13/15 1100  BP: 91/60  Pulse: 64  Weight: 140 lb (63.504 kg)    Fetal Status: Fetal Heart Rate (bpm): 145 Fundal Height: 26 cm Movement: Present     General:  Alert, oriented and cooperative. Patient is in no acute distress.  Skin: Skin is warm and dry. No rash noted.   Cardiovascular: Normal heart rate noted  Respiratory: Normal respiratory effort, no problems with respiration noted  Abdomen: Soft, gravid, appropriate for gestational age. Pain/Pressure: Absent     Pelvic: Vag. Bleeding: None Vag D/C Character: Thin   Cervical exam deferred        Extremities: Normal range of motion.  Edema: None  Mental Status: Normal mood and affect. Normal behavior. Normal judgment and thought content.   Urinalysis: Urine Protein: Negative Urine Glucose: Negative  Assessment and Plan:  Pregnancy: G2P1001 at [redacted]w[redacted]d - Flu vaccine today - Glucola, labs, tdap at next  Discussed weight gain There are no diagnoses linked to this encounter. Preterm labor symptoms and general obstetric precautions including but not limited to vaginal bleeding, contractions, leaking of fluid and fetal movement were reviewed in detail with the patient. Please refer to After Visit Summary for other counseling recommendations.  Return in about 4 weeks (around 07/11/2015) for glucola.   Allie Bossier, MD

## 2015-07-11 ENCOUNTER — Ambulatory Visit (INDEPENDENT_AMBULATORY_CARE_PROVIDER_SITE_OTHER): Payer: Commercial Indemnity | Admitting: Family Medicine

## 2015-07-11 ENCOUNTER — Telehealth: Payer: Self-pay | Admitting: *Deleted

## 2015-07-11 VITALS — BP 110/76 | HR 93 | Wt 151.0 lb

## 2015-07-11 DIAGNOSIS — Z3483 Encounter for supervision of other normal pregnancy, third trimester: Secondary | ICD-10-CM | POA: Diagnosis not present

## 2015-07-11 DIAGNOSIS — Z23 Encounter for immunization: Secondary | ICD-10-CM | POA: Diagnosis not present

## 2015-07-11 DIAGNOSIS — Z3482 Encounter for supervision of other normal pregnancy, second trimester: Secondary | ICD-10-CM

## 2015-07-11 DIAGNOSIS — Z3493 Encounter for supervision of normal pregnancy, unspecified, third trimester: Secondary | ICD-10-CM

## 2015-07-11 NOTE — Patient Instructions (Signed)
Third Trimester of Pregnancy The third trimester is from week 29 through week 42, months 7 through 9. The third trimester is a time when the fetus is growing rapidly. At the end of the ninth month, the fetus is about 20 inches in length and weighs 6-10 pounds.  BODY CHANGES Your body goes through many changes during pregnancy. The changes vary from woman to woman.   Your weight will continue to increase. You can expect to gain 25-35 pounds (11-16 kg) by the end of the pregnancy.  You may begin to get stretch marks on your hips, abdomen, and breasts.  You may urinate more often because the fetus is moving lower into your pelvis and pressing on your bladder.  You may develop or continue to have heartburn as a result of your pregnancy.  You may develop constipation because certain hormones are causing the muscles that push waste through your intestines to slow down.  You may develop hemorrhoids or swollen, bulging veins (varicose veins).  You may have pelvic pain because of the weight gain and pregnancy hormones relaxing your joints between the bones in your pelvis. Backaches may result from overexertion of the muscles supporting your posture.  You may have changes in your hair. These can include thickening of your hair, rapid growth, and changes in texture. Some women also have hair loss during or after pregnancy, or hair that feels dry or thin. Your hair will most likely return to normal after your baby is born.  Your breasts will continue to grow and be tender. A yellow discharge may leak from your breasts called colostrum.  Your belly button may stick out.  You may feel short of breath because of your expanding uterus.  You may notice the fetus "dropping," or moving lower in your abdomen.  You may have a bloody mucus discharge. This usually occurs a few days to a week before labor begins.  Your cervix becomes thin and soft (effaced) near your due date. WHAT TO EXPECT AT YOUR  PRENATAL EXAMS  You will have prenatal exams every 2 weeks until week 36. Then, you will have weekly prenatal exams. During a routine prenatal visit:  You will be weighed to make sure you and the fetus are growing normally.  Your blood pressure is taken.  Your abdomen will be measured to track your baby's growth.  The fetal heartbeat will be listened to.  Any test results from the previous visit will be discussed.  You may have a cervical check near your due date to see if you have effaced. At around 36 weeks, your caregiver will check your cervix. At the same time, your caregiver will also perform a test on the secretions of the vaginal tissue. This test is to determine if a type of bacteria, Group B streptococcus, is present. Your caregiver will explain this further. Your caregiver may ask you:  What your birth plan is.  How you are feeling.  If you are feeling the baby move.  If you have had any abnormal symptoms, such as leaking fluid, bleeding, severe headaches, or abdominal cramping.  If you have any questions. Other tests or screenings that may be performed during your third trimester include:  Blood tests that check for low iron levels (anemia).  Fetal testing to check the health, activity level, and growth of the fetus. Testing is done if you have certain medical conditions or if there are problems during the pregnancy. FALSE LABOR You may feel small, irregular contractions that   eventually go away. These are called Braxton Hicks contractions, or false labor. Contractions may last for hours, days, or even weeks before true labor sets in. If contractions come at regular intervals, intensify, or become painful, it is best to be seen by your caregiver.  SIGNS OF LABOR   Menstrual-like cramps.  Contractions that are 5 minutes apart or less.  Contractions that start on the top of the uterus and spread down to the lower abdomen and back.  A sense of increased pelvic  pressure or back pain.  A watery or bloody mucus discharge that comes from the vagina. If you have any of these signs before the 37th week of pregnancy, call your caregiver right away. You need to go to the hospital to get checked immediately. HOME CARE INSTRUCTIONS   Avoid all smoking, herbs, alcohol, and unprescribed drugs. These chemicals affect the formation and growth of the baby.  Follow your caregiver's instructions regarding medicine use. There are medicines that are either safe or unsafe to take during pregnancy.  Exercise only as directed by your caregiver. Experiencing uterine cramps is a good sign to stop exercising.  Continue to eat regular, healthy meals.  Wear a good support bra for breast tenderness.  Do not use hot tubs, steam rooms, or saunas.  Wear your seat belt at all times when driving.  Avoid raw meat, uncooked cheese, cat litter boxes, and soil used by cats. These carry germs that can cause birth defects in the baby.  Take your prenatal vitamins.  Try taking a stool softener (if your caregiver approves) if you develop constipation. Eat more high-fiber foods, such as fresh vegetables or fruit and whole grains. Drink plenty of fluids to keep your urine clear or pale yellow.  Take warm sitz baths to soothe any pain or discomfort caused by hemorrhoids. Use hemorrhoid cream if your caregiver approves.  If you develop varicose veins, wear support hose. Elevate your feet for 15 minutes, 3-4 times a day. Limit salt in your diet.  Avoid heavy lifting, wear low heal shoes, and practice good posture.  Rest a lot with your legs elevated if you have leg cramps or low back pain.  Visit your dentist if you have not gone during your pregnancy. Use a soft toothbrush to brush your teeth and be gentle when you floss.  A sexual relationship may be continued unless your caregiver directs you otherwise.  Do not travel far distances unless it is absolutely necessary and only  with the approval of your caregiver.  Take prenatal classes to understand, practice, and ask questions about the labor and delivery.  Make a trial run to the hospital.  Pack your hospital bag.  Prepare the baby's nursery.  Continue to go to all your prenatal visits as directed by your caregiver. SEEK MEDICAL CARE IF:  You are unsure if you are in labor or if your water has broken.  You have dizziness.  You have mild pelvic cramps, pelvic pressure, or nagging pain in your abdominal area.  You have persistent nausea, vomiting, or diarrhea.  You have a bad smelling vaginal discharge.  You have pain with urination. SEEK IMMEDIATE MEDICAL CARE IF:   You have a fever.  You are leaking fluid from your vagina.  You have spotting or bleeding from your vagina.  You have severe abdominal cramping or pain.  You have rapid weight loss or gain.  You have shortness of breath with chest pain.  You notice sudden or extreme swelling   of your face, hands, ankles, feet, or legs.  You have not felt your baby move in over an hour.  You have severe headaches that do not go away with medicine.  You have vision changes. Document Released: 09/17/2001 Document Revised: 09/28/2013 Document Reviewed: 11/24/2012 [employer] Patient Information 2015 [employer]. This information is not intended to replace advice given to you by your health care provider. Make sure you discuss any questions you have with your health care provider.  Breastfeeding Deciding to breastfeed is one of the best choices you can make for you and your baby. A change in hormones during pregnancy causes your breast tissue to grow and increases the number and size of your milk ducts. These hormones also allow proteins, sugars, and fats from your blood supply to make breast milk in your milk-producing glands. Hormones prevent breast milk from being released before your baby is born as well as prompt milk flow after birth. Once  breastfeeding has begun, thoughts of your baby, as well as his or her sucking or crying, can stimulate the release of milk from your milk-producing glands.  BENEFITS OF BREASTFEEDING For Your Baby  Your first milk (colostrum) helps your baby's digestive system function better.   There are antibodies in your milk that help your baby fight off infections.   Your baby has a lower incidence of asthma, allergies, and sudden infant death syndrome.   The nutrients in breast milk are better for your baby than infant formulas and are designed uniquely for your baby's needs.   Breast milk improves your baby's brain development.   Your baby is less likely to develop other conditions, such as childhood obesity, asthma, or type 2 diabetes mellitus.  For You   Breastfeeding helps to create a very special bond between you and your baby.   Breastfeeding is convenient. Breast milk is always available at the correct temperature and costs nothing.   Breastfeeding helps to burn calories and helps you lose the weight gained during pregnancy.   Breastfeeding makes your uterus contract to its prepregnancy size faster and slows bleeding (lochia) after you give birth.   Breastfeeding helps to lower your risk of developing type 2 diabetes mellitus, osteoporosis, and breast or ovarian cancer later in life. SIGNS THAT YOUR BABY IS HUNGRY Early Signs of Hunger  Increased alertness or activity.  Stretching.  Movement of the head from side to side.  Movement of the head and opening of the mouth when the corner of the mouth or cheek is stroked (rooting).  Increased sucking sounds, smacking lips, cooing, sighing, or squeaking.  Hand-to-mouth movements.  Increased sucking of fingers or hands. Late Signs of Hunger  Fussing.  Intermittent crying. Extreme Signs of Hunger Signs of extreme hunger will require calming and consoling before your baby will be able to breastfeed successfully. Do not  wait for the following signs of extreme hunger to occur before you initiate breastfeeding:   Restlessness.  A loud, strong cry.   Screaming. BREASTFEEDING BASICS Breastfeeding Initiation  Find a comfortable place to sit or lie down, with your neck and back well supported.  Place a pillow or rolled up blanket under your baby to bring him or her to the level of your breast (if you are seated). Nursing pillows are specially designed to help support your arms and your baby while you breastfeed.  Make sure that your baby's abdomen is facing your abdomen.   Gently massage your breast. With your fingertips, massage from your chest   wall toward your nipple in a circular motion. This encourages milk flow. You may need to continue this action during the feeding if your milk flows slowly.  Support your breast with 4 fingers underneath and your thumb above your nipple. Make sure your fingers are well away from your nipple and your baby's mouth.   Stroke your baby's lips gently with your finger or nipple.   When your baby's mouth is open wide enough, quickly bring your baby to your breast, placing your entire nipple and as much of the colored area around your nipple (areola) as possible into your baby's mouth.   More areola should be visible above your baby's upper lip than below the lower lip.   Your baby's tongue should be between his or her lower gum and your breast.   Ensure that your baby's mouth is correctly positioned around your nipple (latched). Your baby's lips should create a seal on your breast and be turned out (everted).  It is common for your baby to suck about 2-3 minutes in order to start the flow of breast milk. Latching Teaching your baby how to latch on to your breast properly is very important. An improper latch can cause nipple pain and decreased milk supply for you and poor weight gain in your baby. Also, if your baby is not latched onto your nipple properly, he or she  may swallow some air during feeding. This can make your baby fussy. Burping your baby when you switch breasts during the feeding can help to get rid of the air. However, teaching your baby to latch on properly is still the best way to prevent fussiness from swallowing air while breastfeeding. Signs that your baby has successfully latched on to your nipple:    Silent tugging or silent sucking, without causing you pain.   Swallowing heard between every 3-4 sucks.    Muscle movement above and in front of his or her ears while sucking.  Signs that your baby has not successfully latched on to nipple:   Sucking sounds or smacking sounds from your baby while breastfeeding.  Nipple pain. If you think your baby has not latched on correctly, slip your finger into the corner of your baby's mouth to break the suction and place it between your baby's gums. Attempt breastfeeding initiation again. Signs of Successful Breastfeeding Signs from your baby:   A gradual decrease in the number of sucks or complete cessation of sucking.   Falling asleep.   Relaxation of his or her body.   Retention of a small amount of milk in his or her mouth.   Letting go of your breast by himself or herself. Signs from you:  Breasts that have increased in firmness, weight, and size 1-3 hours after feeding.   Breasts that are softer immediately after breastfeeding.  Increased milk volume, as well as a change in milk consistency and color by the fifth day of breastfeeding.   Nipples that are not sore, cracked, or bleeding. Signs That Your Baby is Getting Enough Milk  Wetting at least 3 diapers in a 24-hour period. The urine should be clear and pale yellow by age 5 days.  At least 3 stools in a 24-hour period by age 5 days. The stool should be soft and yellow.  At least 3 stools in a 24-hour period by age 7 days. The stool should be seedy and yellow.  No loss of weight greater than 10% of birth weight  during the first 3   days of age.  Average weight gain of 4-7 ounces (113-198 g) per week after age 4 days.  Consistent daily weight gain by age 5 days, without weight loss after the age of 2 weeks. After a feeding, your baby may spit up a small amount. This is common. BREASTFEEDING FREQUENCY AND DURATION Frequent feeding will help you make more milk and can prevent sore nipples and breast engorgement. Breastfeed when you feel the need to reduce the fullness of your breasts or when your baby shows signs of hunger. This is called "breastfeeding on demand." Avoid introducing a pacifier to your baby while you are working to establish breastfeeding (the first 4-6 weeks after your baby is born). After this time you may choose to use a pacifier. Research has shown that pacifier use during the first year of a baby's life decreases the risk of sudden infant death syndrome (SIDS). Allow your baby to feed on each breast as long as he or she wants. Breastfeed until your baby is finished feeding. When your baby unlatches or falls asleep while feeding from the first breast, offer the second breast. Because newborns are often sleepy in the first few weeks of life, you may need to awaken your baby to get him or her to feed. Breastfeeding times will vary from baby to baby. However, the following rules can serve as a guide to help you ensure that your baby is properly fed:  Newborns (babies 4 weeks of age or younger) may breastfeed every 1-3 hours.  Newborns should not go longer than 3 hours during the day or 5 hours during the night without breastfeeding.  You should breastfeed your baby a minimum of 8 times in a 24-hour period until you begin to introduce solid foods to your baby at around 6 months of age. BREAST MILK PUMPING Pumping and storing breast milk allows you to ensure that your baby is exclusively fed your breast milk, even at times when you are unable to breastfeed. This is especially important if you are  going back to work while you are still breastfeeding or when you are not able to be present during feedings. Your lactation consultant can give you guidelines on how long it is safe to store breast milk.  A breast pump is a machine that allows you to pump milk from your breast into a sterile bottle. The pumped breast milk can then be stored in a refrigerator or freezer. Some breast pumps are operated by hand, while others use electricity. Ask your lactation consultant which type will work best for you. Breast pumps can be purchased, but some hospitals and breastfeeding support groups lease breast pumps on a monthly basis. A lactation consultant can teach you how to hand express breast milk, if you prefer not to use a pump.  CARING FOR YOUR BREASTS WHILE YOU BREASTFEED Nipples can become dry, cracked, and sore while breastfeeding. The following recommendations can help keep your breasts moisturized and healthy:  Avoid using soap on your nipples.   Wear a supportive bra. Although not required, special nursing bras and tank tops are designed to allow access to your breasts for breastfeeding without taking off your entire bra or top. Avoid wearing underwire-style bras or extremely tight bras.  Air dry your nipples for 3-4minutes after each feeding.   Use only cotton bra pads to absorb leaked breast milk. Leaking of breast milk between feedings is normal.   Use lanolin on your nipples after breastfeeding. Lanolin helps to maintain your skin's   normal moisture barrier. If you use pure lanolin, you do not need to wash it off before feeding your baby again. Pure lanolin is not toxic to your baby. You may also hand express a few drops of breast milk and gently massage that milk into your nipples and allow the milk to air dry. In the first few weeks after giving birth, some women experience extremely full breasts (engorgement). Engorgement can make your breasts feel heavy, warm, and tender to the touch.  Engorgement peaks within 3-5 days after you give birth. The following recommendations can help ease engorgement:  Completely empty your breasts while breastfeeding or pumping. You may want to start by applying warm, moist heat (in the shower or with warm water-soaked hand towels) just before feeding or pumping. This increases circulation and helps the milk flow. If your baby does not completely empty your breasts while breastfeeding, pump any extra milk after he or she is finished.  Wear a snug bra (nursing or regular) or tank top for 1-2 days to signal your body to slightly decrease milk production.  Apply ice packs to your breasts, unless this is too uncomfortable for you.  Make sure that your baby is latched on and positioned properly while breastfeeding. If engorgement persists after 48 hours of following these recommendations, contact your health care provider or a lactation consultant. OVERALL HEALTH CARE RECOMMENDATIONS WHILE BREASTFEEDING  Eat healthy foods. Alternate between meals and snacks, eating 3 of each per day. Because what you eat affects your breast milk, some of the foods may make your baby more irritable than usual. Avoid eating these foods if you are sure that they are negatively affecting your baby.  Drink milk, fruit juice, and water to satisfy your thirst (about 10 glasses a day).   Rest often, relax, and continue to take your prenatal vitamins to prevent fatigue, stress, and anemia.  Continue breast self-awareness checks.  Avoid chewing and smoking tobacco.  Avoid alcohol and drug use. Some medicines that may be harmful to your baby can pass through breast milk. It is important to ask your health care provider before taking any medicine, including all over-the-counter and prescription medicine as well as vitamin and herbal supplements. It is possible to become pregnant while breastfeeding. If birth control is desired, ask your health care provider about options that  will be safe for your baby. SEEK MEDICAL CARE IF:   You feel like you want to stop breastfeeding or have become frustrated with breastfeeding.  You have painful breasts or nipples.  Your nipples are cracked or bleeding.  Your breasts are red, tender, or warm.  You have a swollen area on either breast.  You have a fever or chills.  You have nausea or vomiting.  You have drainage other than breast milk from your nipples.  Your breasts do not become full before feedings by the fifth day after you give birth.  You feel sad and depressed.  Your baby is too sleepy to eat well.  Your baby is having trouble sleeping.   Your baby is wetting less than 3 diapers in a 24-hour period.  Your baby has less than 3 stools in a 24-hour period.  Your baby's skin or the white part of his or her eyes becomes yellow.   Your baby is not gaining weight by 5 days of age. SEEK IMMEDIATE MEDICAL CARE IF:   Your baby is overly tired (lethargic) and does not want to wake up and feed.  Your baby   develops an unexplained fever. Document Released: 09/23/2005 Document Revised: 09/28/2013 Document Reviewed: 03/17/2013 [employer] Patient Information 2015 [employer]. This information is not intended to replace advice given to you by your health care provider. Make sure you discuss any questions you have with your health care provider.  

## 2015-07-11 NOTE — Progress Notes (Signed)
Subjective:  Riana Tessmer is a 34 y.o. G2P1001 at 106w1d being seen today for ongoing prenatal care.  Patient reports no complaints.  Contractions: Not present.  Vag. Bleeding: None. Movement: Present. Denies leaking of fluid.   The following portions of the patient's history were reviewed and updated as appropriate: allergies, current medications, past family history, past medical history, past social history, past surgical history and problem list.   Objective:   Filed Vitals:   07/11/15 1048  BP: 110/76  Pulse: 93  Weight: 151 lb (68.493 kg)    Fetal Status: Fetal Heart Rate (bpm): 145 Fundal Height: 32 cm Movement: Present     General:  Alert, oriented and cooperative. Patient is in no acute distress.  Skin: Skin is warm and dry. No rash noted.   Cardiovascular: Normal heart rate noted  Respiratory: Normal respiratory effort, no problems with respiration noted  Abdomen: Soft, gravid, appropriate for gestational age. Pain/Pressure: Absent     Pelvic: Vag. Bleeding: None Vag D/C Character: Thin   Cervical exam deferred        Extremities: Normal range of motion.  Edema: None  Mental Status: Normal mood and affect. Normal behavior. Normal judgment and thought content.   Urinalysis: Urine Protein: Negative Urine Glucose: Negative  Assessment and Plan:  Pregnancy: G2P1001 at [redacted]w[redacted]d  1. Supervision of normal pregnancy in third trimester Continue routine prenatal care.  - Tdap vaccine greater than or equal to 7yo IM - CBC - RPR - HIV antibody (with reflex) - Glucose tolerance, 1 hour - [place] MFM OB FOLLOW UP; Future for S>D   Preterm labor symptoms and general obstetric precautions including but not limited to vaginal bleeding, contractions, leaking of fluid and fetal movement were reviewed in detail with the patient. Please refer to After Visit Summary for other counseling recommendations.  Return in 2 weeks (on 07/25/2015).   Reva Bores, MD

## 2015-07-11 NOTE — Telephone Encounter (Signed)
Pt had [place] ordered for S>D, had some concerns about the findings, needing reassurance.  Informed pt that she was measuring larger than her dates today so the physician recommended an [place] to check the fluid and size of the baby just to make sure everything was measuring normal.  Pt will call MFM to see about getting in for [place] sooner for peace of mind.  Instructed to call back with any further concerns or questions.

## 2015-07-12 ENCOUNTER — Ambulatory Visit (INDEPENDENT_AMBULATORY_CARE_PROVIDER_SITE_OTHER): Payer: Commercial Indemnity | Admitting: Certified Nurse Midwife

## 2015-07-12 ENCOUNTER — Telehealth: Payer: Self-pay | Admitting: *Deleted

## 2015-07-12 VITALS — BP 103/70 | HR 96 | Wt 149.0 lb

## 2015-07-12 DIAGNOSIS — N898 Other specified noninflammatory disorders of vagina: Secondary | ICD-10-CM

## 2015-07-12 DIAGNOSIS — Z3493 Encounter for supervision of normal pregnancy, unspecified, third trimester: Secondary | ICD-10-CM

## 2015-07-12 DIAGNOSIS — O26893 Other specified pregnancy related conditions, third trimester: Secondary | ICD-10-CM

## 2015-07-12 DIAGNOSIS — Z3483 Encounter for supervision of other normal pregnancy, third trimester: Secondary | ICD-10-CM

## 2015-07-12 NOTE — Addendum Note (Signed)
Addended by: Tandy Gaw C on: 07/12/2015 08:01 AM   Modules accepted: Orders

## 2015-07-12 NOTE — Telephone Encounter (Signed)
A lavender tube was left in the lab and was not sent to [facility] when all patients other labs were sent off.  I spoke with [facility] and they told me to resubmit the CBC order and send in the lavender tube today.

## 2015-07-12 NOTE — Progress Notes (Signed)
Subjective:  Theresa Coleman is a 34 y.o. G2P1001 at [redacted]w[redacted]d being seen today for ongoing prenatal care.  Patient reports vaginal discharge.  Contractions: Not present.  Vag. Bleeding: None. Movement: Present. Denies leaking of fluid.   The following portions of the patient's history were reviewed and updated as appropriate: allergies, current medications, past family history, past medical history, past social history, past surgical history and problem list.   Objective:   Filed Vitals:   07/12/15 1404  BP: 103/70  Pulse: 96  Weight: 149 lb (67.586 kg)    Fetal Status: Fetal Heart Rate (bpm): 147 Fundal Height: 32 cm Movement: Present     General:  Alert, oriented and cooperative. Patient is in no acute distress.  Skin: Skin is warm and dry. No rash noted.   Cardiovascular: Normal heart rate noted  Respiratory: Normal respiratory effort, no problems with respiration noted  Abdomen: Soft, gravid, appropriate for gestational age. Pain/Pressure: Absent     Pelvic: Vag. Bleeding: None Vag D/C Character: Thin   Greenish chunky dc Cervical exam performed Dilation: Closed Effacement (%): Thick Station: -2  Extremities: Normal range of motion.  Edema: None  Mental Status: Normal mood and affect. Normal behavior. Normal judgment and thought content.   Urinalysis: Urine Protein: Negative Urine Glucose: Negative Large Leuks  Assessment and Plan:  Pregnancy: G2P1001 at [redacted]w[redacted]d Plan: urine culture Wet prep GC/Ch from urine There are no diagnoses linked to this encounter. Preterm labor symptoms and general obstetric precautions including but not limited to vaginal bleeding, contractions, leaking of fluid and fetal movement were reviewed in detail with the patient. Please refer to After Visit Summary for other counseling recommendations.  Return if symptoms worsen or fail to improve.   Rhea Pink, CNM

## 2015-07-13 LAB — URINE CULTURE

## 2015-07-13 LAB — HIV ANTIBODY (ROUTINE TESTING W REFLEX): HIV Screen 4th Generation wRfx: NONREACTIVE

## 2015-07-13 LAB — RPR: RPR Ser Ql: NONREACTIVE

## 2015-07-15 LAB — NUSWAB VAGINITIS PLUS (VG+)
Chlamydia trachomatis, NAA: NEGATIVE
Neisseria gonorrhoeae, NAA: NEGATIVE

## 2015-07-15 LAB — GLUCOSE TOLERANCE, 1 HOUR: Glucose, 1Hr PP: 118 mg/dL (ref 65–199)

## 2015-07-19 ENCOUNTER — Ambulatory Visit (HOSPITAL_COMMUNITY)
Admission: RE | Admit: 2015-07-19 | Discharge: 2015-07-19 | Disposition: A | Discharge status: Home / Self Care | Payer: Commercial Indemnity | Source: Ambulatory Visit | Attending: Family Medicine | Admitting: Family Medicine

## 2015-07-19 DIAGNOSIS — Z3493 Encounter for supervision of normal pregnancy, unspecified, third trimester: Secondary | ICD-10-CM

## 2015-07-19 DIAGNOSIS — Z3483 Encounter for supervision of other normal pregnancy, third trimester: Secondary | ICD-10-CM | POA: Diagnosis not present

## 2015-07-26 ENCOUNTER — Encounter: Payer: Commercial Indemnity | Admitting: Obstetrics & Gynecology

## 2015-07-26 ENCOUNTER — Ambulatory Visit (INDEPENDENT_AMBULATORY_CARE_PROVIDER_SITE_OTHER): Payer: Commercial Indemnity | Admitting: Obstetrics & Gynecology

## 2015-07-26 ENCOUNTER — Encounter: Payer: Self-pay | Admitting: Obstetrics & Gynecology

## 2015-07-26 VITALS — BP 108/70 | HR 96 | Wt 155.0 lb

## 2015-07-26 DIAGNOSIS — Z3483 Encounter for supervision of other normal pregnancy, third trimester: Secondary | ICD-10-CM

## 2015-07-26 DIAGNOSIS — O34219 Maternal care for unspecified type scar from previous cesarean delivery: Secondary | ICD-10-CM

## 2015-07-26 NOTE — Progress Notes (Signed)
Subjective: considering RCS and BTL  Theresa Coleman is a 34 y.o. G2P1001 at 7224w2d being seen today for ongoing prenatal care.  Patient reports no complaints.  Contractions: Not present.  Vag. Bleeding: None. Movement: Present. Denies leaking of fluid.   The following portions of the patient's history were reviewed and updated as appropriate: allergies, current medications, past family history, past medical history, past social history, past surgical history and problem list. Problem list updated.  Objective:   Filed Vitals:   07/26/15 1103  BP: 108/70  Pulse: 96  Weight: 155 lb (70.308 kg)    Fetal Status: Fetal Heart Rate (bpm): 146   Movement: Present     General:  Alert, oriented and cooperative. Patient is in no acute distress.  Skin: Skin is warm and dry. No rash noted.   Cardiovascular: Normal heart rate noted  Respiratory: Normal respiratory effort, no problems with respiration noted  Abdomen: Soft, gravid, appropriate for gestational age. Pain/Pressure: Absent     Pelvic: Vag. Bleeding: None Vag D/C Character: Thin   Cervical exam deferred        Extremities: Normal range of motion.  Edema: Trace  Mental Status: Normal mood and affect. Normal behavior. Normal judgment and thought content.   Urinalysis:      Assessment and Plan:  Pregnancy: G2P1001 at 3924w2d  1. History of cesarean delivery, antepartum Counseled re TOLAC vs RCS, given information  Preterm labor symptoms and general obstetric precautions including but not limited to vaginal bleeding, contractions, leaking of fluid and fetal movement were reviewed in detail with the patient. Please refer to After Visit Summary for other counseling recommendations.  Return in about 2 weeks (around 08/09/2015).   Adam PhenixJames G Arnold, MD

## 2015-07-26 NOTE — Patient Instructions (Signed)
Vaginal Birth After Cesarean Delivery  Vaginal birth after cesarean delivery (VBAC) is giving birth vaginally after previously delivering a baby by a cesarean. In the past, if a woman had a cesarean delivery, all births afterward would be done by cesarean delivery. This is no longer true. It can be safe for the mother to try a vaginal delivery after having a cesarean delivery.   It is important to discuss VBAC with your health care provider early in the pregnancy so you can understand the risks, benefits, and options. It will give you time to decide what is best in your particular case. The final decision about whether to have a VBAC or repeat cesarean delivery should be between you and your health care provider. Any changes in your health or your baby's health during your pregnancy may make it necessary to change your initial decision about VBAC.   WOMEN WHO PLAN TO HAVE A VBAC SHOULD CHECK WITH THEIR HEALTH CARE PROVIDER TO BE SURE THAT:  · The previous cesarean delivery was done with a low transverse uterine cut (incision) (not a vertical classical incision).    · The birth canal is big enough for the baby.    · There were no other operations on the uterus.    · An electronic fetal monitor (EFM) will be on at all times during labor.    · An operating room will be available and ready in case an emergency cesarean delivery is needed.    · A health care provider and surgical nursing staff will be available at all times during labor to be ready to do an emergency delivery cesarean if necessary.    · An anesthesiologist will be present in case an emergency cesarean delivery is needed.    · The nursery is prepared and has adequate personnel and necessary equipment available to care for the baby in case of an emergency cesarean delivery.  BENEFITS OF VBAC  · Shorter stay in the hospital.    · Avoidance of risks associated with cesarean delivery, such as:    Surgical complications, such as opening of the incision or  hernia in the incision.    Injury to other organs.    Fever. This can occur if an infection develops after surgery. It can also occur as a reaction to the medicine given to make you numb during the surgery.  · Less blood loss and need for blood transfusions.  · Lower risk of blood clots and infection.   · Shorter recovery.    · Decreased risk for having to remove the uterus (hysterectomy).    · Decreased risk for the placenta to completely or partially cover the opening of the uterus (placenta previa) with a future pregnancy.    · Decrease risk in future labor and delivery.  RISKS OF A VBAC  · Tearing (rupture) of the uterus. This is occurs in less than 1% of VBACs. The risk of this happening is higher if:    Steps are taken to begin the labor process (induce labor) or stimulate or strengthen contractions (augment labor).      Medicine is used to soften (ripen) the cervix.  · Having to remove the uterus (hysterectomy) if it ruptures.  VBAC SHOULD NOT BE DONE IF:  · The previous cesarean delivery was done with a vertical (classical) or T-shaped incision or you do not know what kind of incision was made.    · You had a ruptured uterus.    · You have had certain types of surgery on your uterus, such as removal of uterine fibroids.   Ask your health care provider about other types of surgeries that prevent you from having a VBAC.  · You have certain medical or childbirth (obstetrical) problems.    · There are problems with the baby.    · You have had two previous cesarean deliveries and no vaginal deliveries.  OTHER FACTS TO KNOW ABOUT VBAC:  · It is safe to have an epidural anesthetic with VBAC.    · It is safe to turn the baby from a breech position (attempt an external cephalic version).    · It is safe to try a VBAC with twins.    · VBAC may not be successful if your baby weights 8.8 lb (4 kg) or more. However, weight predictions are not always accurate and should not be used alone to decide if VBAC is right for  you.  · There is an increased failure rate if the time between the cesarean delivery and VBAC is less than 19 months.    · Your health care provider may advise against a VBAC if you have preeclampsia (high blood pressure, protein in the urine, and swelling of face and extremities).    · VBAC is often successful if you previously gave birth vaginally.    · VBAC is often successful when the labor starts spontaneously before the due date.    · Delivering a baby through a VBAC is similar to having a normal spontaneous vaginal delivery.     This information is not intended to replace advice given to you by your health care provider. Make sure you discuss any questions you have with your health care provider.     Document Released: 03/16/2007 Document Revised: 10/14/2014 Document Reviewed: 04/22/2013  Elsevier Interactive Patient Education ©2016 [employer]

## 2015-08-03 ENCOUNTER — Encounter: Payer: Self-pay | Admitting: *Deleted

## 2015-08-09 ENCOUNTER — Encounter (HOSPITAL_COMMUNITY): Payer: Self-pay | Admitting: *Deleted

## 2015-08-09 ENCOUNTER — Ambulatory Visit (INDEPENDENT_AMBULATORY_CARE_PROVIDER_SITE_OTHER): Payer: Commercial Indemnity | Admitting: Obstetrics & Gynecology

## 2015-08-09 VITALS — BP 103/69 | HR 101 | Wt 161.0 lb

## 2015-08-09 DIAGNOSIS — O34219 Maternal care for unspecified type scar from previous cesarean delivery: Secondary | ICD-10-CM

## 2015-08-09 DIAGNOSIS — O26899 Other specified pregnancy related conditions, unspecified trimester: Secondary | ICD-10-CM

## 2015-08-09 DIAGNOSIS — Z3482 Encounter for supervision of other normal pregnancy, second trimester: Secondary | ICD-10-CM

## 2015-08-09 DIAGNOSIS — G5600 Carpal tunnel syndrome, unspecified upper limb: Secondary | ICD-10-CM

## 2015-08-09 NOTE — Progress Notes (Signed)
Pt c/o Carpal Tunnel type symptoms in her hands especially in the mornings.

## 2015-08-09 NOTE — Patient Instructions (Signed)
Return to clinic for any obstetric concerns or go to [facility] for evaluation  

## 2015-08-09 NOTE — Progress Notes (Signed)
Subjective:  Theresa Coleman is a 34 y.o. G2P1001 at 4220w2d being seen today for ongoing prenatal care.  Patient reports carpal tunnel symptoms.  Contractions: Not present.  Vag. Bleeding: None. Movement: Present. Denies leaking of fluid.   The following portions of the patient's history were reviewed and updated as appropriate: allergies, current medications, past family history, past medical history, past social history, past surgical history and problem list. Problem list updated.  Objective:   Filed Vitals:   08/09/15 1105  BP: 103/69  Pulse: 101  Weight: 161 lb (73.029 kg)    Fetal Status: Fetal Heart Rate (bpm): 153 Fundal Height: 33 cm Movement: Present     General:  Alert, oriented and cooperative. Patient is in no acute distress.  Skin: Skin is warm and dry. No rash noted.   Cardiovascular: Normal heart rate noted  Respiratory: Normal respiratory effort, no problems with respiration noted  Abdomen: Soft, gravid, appropriate for gestational age. Pain/Pressure: Absent     Pelvic: Vag. Bleeding: None Vag D/C Character: Thin  Cervical exam deferred        Extremities: Normal range of motion.  Edema: Mild pitting, slight indentation  Mental Status: Normal mood and affect. Normal behavior. Normal judgment and thought content.   Urinalysis: Urine Protein: Trace Urine Glucose: Negative  Assessment and Plan:  Pregnancy: G2P1001 at 220w2d  1. Carpal tunnel syndrome during pregnancy Recommended OTC wrist support devices.  If worsens, may need referral to Orthopedist, will continue to monitor.  2. Previous cesarean delivery, antepartum Desires RCS and BTS.  [facility] (09/25/15).    3. Supervision of other normal pregnancy, antepartum, second trimester Preterm labor symptoms and general obstetric precautions including but not limited to vaginal bleeding, contractions, leaking of fluid and fetal movement were reviewed in detail with  the patient. Please refer to After Visit Summary for other counseling recommendations.  Return in about 2 weeks (around 08/23/2015) for OB Visit.   Tereso NewcomerUgonna A Zamarion Longest, MD

## 2015-08-23 ENCOUNTER — Ambulatory Visit (INDEPENDENT_AMBULATORY_CARE_PROVIDER_SITE_OTHER): Payer: Commercial Indemnity | Admitting: Obstetrics & Gynecology

## 2015-08-23 VITALS — BP 104/73 | HR 90 | Wt 166.0 lb

## 2015-08-23 DIAGNOSIS — Z3483 Encounter for supervision of other normal pregnancy, third trimester: Secondary | ICD-10-CM

## 2015-08-23 DIAGNOSIS — O34219 Maternal care for unspecified type scar from previous cesarean delivery: Secondary | ICD-10-CM

## 2015-08-23 NOTE — Patient Instructions (Signed)
Return to clinic for any obstetric concerns or go to [facility] for evaluation  

## 2015-08-23 NOTE — Progress Notes (Signed)
Subjective:  Theresa Coleman is a 34 y.o. G2P1001 at 3837w2d being seen today for ongoing prenatal care.  Patient reports no complaints.  Contractions: Not present.  Vag. Bleeding: None. Movement: Present. Denies leaking of fluid.   The following portions of the patient's history were reviewed and updated as appropriate: allergies, current medications, past family history, past medical history, past social history, past surgical history and problem list. Problem list updated.  Objective:   Filed Vitals:   08/23/15 1111  BP: 104/73  Pulse: 90  Weight: 166 lb (75.297 kg)    Fetal Status: Fetal Heart Rate (bpm): 154 Fundal Height: 36 cm Movement: Present     General:  Alert, oriented and cooperative. Patient is in no acute distress.  Skin: Skin is warm and dry. No rash noted.   Cardiovascular: Normal heart rate noted  Respiratory: Normal respiratory effort, no problems with respiration noted  Abdomen: Soft, gravid, appropriate for gestational age. Pain/Pressure: Absent     Pelvic: Vag. Bleeding: None Vag D/C Character: Thin   Cervical exam deferred        Extremities: Normal range of motion.  Edema: Mild pitting, slight indentation  Mental Status: Normal mood and affect. Normal behavior. Normal judgment and thought content.   Urinalysis: Urine Protein: Negative Urine Glucose: Negative  Assessment and Plan:  Pregnancy: G2P1001 at 6137w2d  1. Previous cesarean delivery, antepartum RCS and BTS scheduled on 09/25/2015  2. Supervision of other normal pregnancy, antepartum, third trimester Preterm labor symptoms and general obstetric precautions including but not limited to vaginal bleeding, contractions, leaking of fluid and fetal movement were reviewed in detail with the patient. Please refer to After Visit Summary for other counseling recommendations.  Return in about 2 weeks (around 09/06/2015) for OB Visit, Pelvic cultures.   Tereso NewcomerUgonna A Anyanwu, MD

## 2015-08-27 ENCOUNTER — Encounter: Payer: Self-pay | Admitting: Internal Medicine

## 2015-08-27 DIAGNOSIS — F32A Depression, unspecified: Secondary | ICD-10-CM | POA: Insufficient documentation

## 2015-08-27 DIAGNOSIS — F329 Major depressive disorder, single episode, unspecified: Secondary | ICD-10-CM | POA: Insufficient documentation

## 2015-09-06 ENCOUNTER — Ambulatory Visit (INDEPENDENT_AMBULATORY_CARE_PROVIDER_SITE_OTHER): Payer: Commercial Indemnity | Admitting: Obstetrics & Gynecology

## 2015-09-06 VITALS — BP 113/74 | HR 81 | Wt 176.0 lb

## 2015-09-06 DIAGNOSIS — Z3483 Encounter for supervision of other normal pregnancy, third trimester: Secondary | ICD-10-CM

## 2015-09-06 DIAGNOSIS — Z36 Encounter for antenatal screening of mother: Secondary | ICD-10-CM | POA: Diagnosis not present

## 2015-09-06 MED ORDER — FLUCONAZOLE 150 MG PO TABS: 150.0000 mg | ORAL_TABLET | Freq: Once | ORAL | Status: DC

## 2015-09-06 NOTE — Progress Notes (Signed)
Subjective:  Theresa Coleman is a 34 y.o. G2P1001 at 3178w2d being seen today for ongoing prenatal care.  She is currently monitored for the following issues for this low-risk pregnancy and has Supervision of other normal pregnancy, antepartum; Previous cesarean delivery, antepartum; and Clinical depression on her problem list.  Patient reports no complaints.  Contractions: Not present. Vag. Bleeding: None.  Movement: Present. Denies leaking of fluid.   The following portions of the patient's history were reviewed and updated as appropriate: allergies, current medications, past family history, past medical history, past social history, past surgical history and problem list. Problem list updated.  Objective:   Filed Vitals:   09/06/15 1108  BP: 113/74  Pulse: 81  Weight: 176 lb (79.833 kg)    Fetal Status: Fetal Heart Rate (bpm): 144   Movement: Present     General:  Alert, oriented and cooperative. Patient is in no acute distress.  Skin: Skin is warm and dry. No rash noted.   Cardiovascular: Normal heart rate noted  Respiratory: Normal respiratory effort, no problems with respiration noted  Abdomen: Soft, gravid, appropriate for gestational age. Pain/Pressure: Absent     Pelvic: Vag. Bleeding: None Vag D/C Character: Thin   Cervical exam performed        Extremities: Normal range of motion.  Edema: Mild pitting, slight indentation  Mental Status: Normal mood and affect. Normal behavior. Normal judgment and thought content.   Urinalysis: Urine Protein: Trace Urine Glucose: Negative  Assessment and Plan:  Pregnancy: G2P1001 at 6278w2d  1. Supervision of other normal pregnancy, antepartum, third trimester  - Culture, beta strep (group b only) - GC/Chlamydia Probe Amp  2. Yeast seen on exam- Diflucan prescribed  Preterm labor symptoms and general obstetric precautions including but not limited to vaginal bleeding, contractions, leaking of fluid and fetal movement were reviewed in  detail with the patient. Please refer to After Visit Summary for other counseling recommendations.  Return in about 1 week (around 09/13/2015).   Theresa BossierMyra C Carmell Elgin, MD

## 2015-09-07 LAB — GC/CHLAMYDIA PROBE AMP
CHLAMYDIA, DNA PROBE: NEGATIVE
Neisseria gonorrhoeae by PCR: NEGATIVE

## 2015-09-10 LAB — CULTURE, BETA STREP (GROUP B ONLY): STREP GP B CULTURE: NEGATIVE

## 2015-09-13 ENCOUNTER — Ambulatory Visit (INDEPENDENT_AMBULATORY_CARE_PROVIDER_SITE_OTHER): Payer: Commercial Indemnity | Admitting: Obstetrics & Gynecology

## 2015-09-13 VITALS — BP 108/74 | HR 89 | Wt 175.0 lb

## 2015-09-13 DIAGNOSIS — O34219 Maternal care for unspecified type scar from previous cesarean delivery: Secondary | ICD-10-CM

## 2015-09-13 DIAGNOSIS — Z3483 Encounter for supervision of other normal pregnancy, third trimester: Secondary | ICD-10-CM

## 2015-09-13 NOTE — Progress Notes (Signed)
Subjective:  Theresa Coleman is a 34 y.o. G2P1001 at 2064w2d being seen today for ongoing prenatal care.  She is currently monitored for the following issues for this low-risk pregnancy and has Supervision of other normal pregnancy, antepartum; Previous cesarean delivery, antepartum; and Clinical depression on her problem list.  Patient reports no complaints.  Contractions: Not present. Vag. Bleeding: None.  Movement: Present. Denies leaking of fluid.   The following portions of the patient's history were reviewed and updated as appropriate: allergies, current medications, past family history, past medical history, past social history, past surgical history and problem list. Problem list updated.  Objective:   Filed Vitals:   09/13/15 1113  BP: 108/74  Pulse: 89  Weight: 175 lb (79.379 kg)    Fetal Status: Fetal Heart Rate (bpm): 153 Fundal Height: 38 cm Movement: Present  Presentation: Vertex  General:  Alert, oriented and cooperative. Patient is in no acute distress.  Skin: Skin is warm and dry. No rash noted.   Cardiovascular: Normal heart rate noted  Respiratory: Normal respiratory effort, no problems with respiration noted  Abdomen: Soft, gravid, appropriate for gestational age. Pain/Pressure: Present     Pelvic: Vag. Bleeding: None Vag D/C Character: Thin   Cervical exam performed Dilation: Closed Effacement (%): 50 Station: -3  Extremities: Normal range of motion.  Edema: Mild pitting, slight indentation  Mental Status: Normal mood and affect. Normal behavior. Normal judgment and thought content.   Urinalysis: Urine Protein: Negative Urine Glucose: Negative  Assessment and Plan:  Pregnancy: G2P1001 at 2164w2d  1. Supervision of other normal pregnancy, antepartum, third trimester Doing well. No significant cervical change.  2. Previous cesarean delivery, antepartum RCS and BTS on 09/25/15  Term labor symptoms and general obstetric precautions including but not limited to  vaginal bleeding, contractions, leaking of fluid and fetal movement were reviewed in detail with the patient. Please refer to After Visit Summary for other counseling recommendations.  Return in about 1 week (around 09/20/2015) for OB Visit.   Tereso NewcomerUgonna A Farzana Koci, MD

## 2015-09-13 NOTE — Patient Instructions (Signed)
Return to clinic for any obstetric concerns or go to [facility] for evaluation  

## 2015-09-19 ENCOUNTER — Other Ambulatory Visit: Payer: Self-pay | Admitting: Obstetrics and Gynecology

## 2015-09-20 ENCOUNTER — Encounter: Payer: Commercial Indemnity | Admitting: Family Medicine

## 2015-09-20 ENCOUNTER — Inpatient Hospital Stay (HOSPITAL_COMMUNITY)
Admission: AD | Admit: 2015-09-20 | Discharge: 2015-09-20 | Disposition: A | Discharge status: Home / Self Care | Payer: Managed Care, Other (non HMO) | Source: Ambulatory Visit | Attending: Obstetrics & Gynecology | Admitting: Obstetrics & Gynecology

## 2015-09-20 ENCOUNTER — Ambulatory Visit (INDEPENDENT_AMBULATORY_CARE_PROVIDER_SITE_OTHER): Payer: Managed Care, Other (non HMO) | Admitting: Certified Nurse Midwife

## 2015-09-20 ENCOUNTER — Encounter: Payer: Self-pay | Admitting: *Deleted

## 2015-09-20 ENCOUNTER — Encounter (HOSPITAL_COMMUNITY): Payer: Self-pay | Admitting: *Deleted

## 2015-09-20 VITALS — BP 138/86 | HR 96 | Wt 182.0 lb

## 2015-09-20 DIAGNOSIS — R030 Elevated blood-pressure reading, without diagnosis of hypertension: Secondary | ICD-10-CM | POA: Diagnosis present

## 2015-09-20 DIAGNOSIS — O1213 Gestational proteinuria, third trimester: Secondary | ICD-10-CM | POA: Diagnosis not present

## 2015-09-20 DIAGNOSIS — O1210 Gestational proteinuria, unspecified trimester: Secondary | ICD-10-CM | POA: Diagnosis not present

## 2015-09-20 DIAGNOSIS — Z3A38 38 weeks gestation of pregnancy: Secondary | ICD-10-CM | POA: Insufficient documentation

## 2015-09-20 DIAGNOSIS — R0989 Other specified symptoms and signs involving the circulatory and respiratory systems: Secondary | ICD-10-CM

## 2015-09-20 DIAGNOSIS — Z87891 Personal history of nicotine dependence: Secondary | ICD-10-CM | POA: Insufficient documentation

## 2015-09-20 DIAGNOSIS — I10 Essential (primary) hypertension: Secondary | ICD-10-CM

## 2015-09-20 DIAGNOSIS — Z880 Allergy status to penicillin: Secondary | ICD-10-CM | POA: Insufficient documentation

## 2015-09-20 DIAGNOSIS — O34219 Maternal care for unspecified type scar from previous cesarean delivery: Secondary | ICD-10-CM

## 2015-09-20 DIAGNOSIS — Z3483 Encounter for supervision of other normal pregnancy, third trimester: Secondary | ICD-10-CM

## 2015-09-20 LAB — COMPREHENSIVE METABOLIC PANEL
ALBUMIN: 2.5 g/dL — AB (ref 3.5–5.0)
ALT: 18 U/L (ref 14–54)
ANION GAP: 8 (ref 5–15)
AST: 21 U/L (ref 15–41)
Alkaline Phosphatase: 190 U/L — ABNORMAL HIGH (ref 38–126)
BUN: 11 mg/dL (ref 6–20)
CHLORIDE: 103 mmol/L (ref 101–111)
CO2: 23 mmol/L (ref 22–32)
Calcium: 8.6 mg/dL — ABNORMAL LOW (ref 8.9–10.3)
Creatinine, Ser: 0.66 mg/dL (ref 0.44–1.00)
GFR calc Af Amer: >60 mL/min (ref >60)
GFR calc non Af Amer: >60 mL/min (ref >60)
GLUCOSE: 103 mg/dL — AB (ref 65–99)
POTASSIUM: 3.5 mmol/L (ref 3.5–5.1)
SODIUM: 134 mmol/L — AB (ref 135–145)
TOTAL PROTEIN: 5.7 g/dL — AB (ref 6.5–8.1)
Total Bilirubin: 0.7 mg/dL (ref 0.3–1.2)

## 2015-09-20 LAB — PROTEIN / CREATININE RATIO, URINE
Creatinine, Urine: 275 mg/dL
PROTEIN CREATININE RATIO: 0.27 mg/mg{creat} — AB (ref 0.00–0.15)
Total Protein, Urine: 74 mg/dL

## 2015-09-20 LAB — CBC
HCT: 33.2 % — ABNORMAL LOW (ref 36.0–46.0)
Hemoglobin: 10.9 g/dL — ABNORMAL LOW (ref 12.0–15.0)
MCH: 29.9 pg (ref 26.0–34.0)
MCHC: 32.8 g/dL (ref 30.0–36.0)
MCV: 91.2 fL (ref 78.0–100.0)
PLATELETS: 148 10*3/uL — AB (ref 150–400)
RBC: 3.64 MIL/uL — ABNORMAL LOW (ref 3.87–5.11)
RDW: 13.4 % (ref 11.5–15.5)
WBC: 9.6 10*3/uL (ref 4.0–10.5)

## 2015-09-20 LAB — URIC ACID: URIC ACID, SERUM: 3.4 mg/dL (ref 2.3–6.6)

## 2015-09-20 LAB — LACTATE DEHYDROGENASE: LDH: 177 U/L (ref 98–192)

## 2015-09-20 NOTE — Patient Instructions (Signed)

## 2015-09-20 NOTE — Progress Notes (Signed)
BP Recheck 120/82, Pt denies any headaches, seeing spots, or blurred vision.  Has atypical edema noted in her extremities

## 2015-09-20 NOTE — Progress Notes (Signed)
Subjective:  Theresa Coleman is a 34 y.o. G2P1001 at 6750w2d being seen today for ongoing prenatal care.  She is currently monitored for the following issues for this high-risk pregnancy and has Supervision of other normal pregnancy, antepartum; Previous cesarean delivery, antepartum; Clinical depression; and Proteinuria affecting pregnancy on her problem list.  Patient reports swelling.  Contractions: Not present. Vag. Bleeding: None.  Movement: Present. Denies leaking of fluid.   The following portions of the patient's history were reviewed and updated as appropriate: allergies, current medications, past family history, past medical history, past social history, past surgical history and problem list. Problem list updated.  Objective:   Filed Vitals:   09/20/15 1356  BP: 138/86  Pulse: 96  Weight: 182 lb (82.555 kg)    Fetal Status: Fetal Heart Rate (bpm): 131   Movement: Present     General:  Alert, oriented and cooperative. Patient is in no acute distress.  Skin: Skin is warm and dry. No rash noted.   Cardiovascular: Normal heart rate noted  Respiratory: Normal respiratory effort, no problems with respiration noted  Abdomen: Soft, gravid, appropriate for gestational age. Pain/Pressure: Present     Pelvic: Vag. Bleeding: None Vag D/C Character: Thin   Cervical exam deferred        Extremities: Normal range of motion.  Edema: Moderate pitting, indentation subsides rapidly  Mental Status: Normal mood and affect. Normal behavior. Normal judgment and thought content.   Urinalysis: Urine Protein: 1+ Urine Glucose: Negative  Assessment and Plan:  Pregnancy: G2P1001 at 6450w2d  1. Previous cesarean delivery, antepartum Scheduled for Monday  2. Proteinuria affecting pregnancy Sent to [facility] for Pre E evaluation  Term labor symptoms and general obstetric precautions including but not limited to vaginal bleeding, contractions, leaking of fluid and fetal movement were reviewed in detail  with the patient. Please refer to After Visit Summary for other counseling recommendations.  Return for schedule postpartum appointment.   Rhea PinkLori A Clemmons, CNM

## 2015-09-20 NOTE — MAU Provider Note (Signed)
History     CSN: [id number]  Arrival date and time: 09/20/15 1454   First Provider Initiated Contact with Patient 09/20/15 1553         Chief Complaint  Patient presents with  . Blood Pressure Check   HPI Theresa Coleman is a 34 y.o. G2P1001 at [redacted]w[redacted]d who presents for BP evaluation. Seen at [facility] today & sent here for BP of 138/86 & proteinuria.  Denies h/a, vision changes, epigastric pain, chest pain, or SOB.  BLE swelling & tenderness.  Positive fetal movement.  No contractions, LOF, or vaginal bleeding.    Pt is scheduled for repeat c/section on 12/19   OB History    Gravida Para Term Preterm AB TAB SAB Ectopic Multiple Living   Past Medical History  Diagnosis Date  . Supervision of other normal pregnancy, antepartum 02/22/2015     Clinic  Prenatal Labs Dating  Blood type:    Genetic Screen 1 Screen:    AFP:     Quad:     NIPS: Antibody:  Anatomic [place]  Rubella:   GTT Early:               Third trimester:  RPR:    Flu vaccine  HBsAg:    TDaP vaccine                                               Rhogam: HIV:    GBS                                              (For PCN allergy, check sensitivities) GBS:  Contraception  Pap: Ba    Past Surgical History  Procedure Laterality Date  . Cesarean section      Family History  Problem Relation Age of Onset  . Stroke Paternal Grandmother   . Cancer Maternal Grandmother     Breast -age 25  . Cancer Maternal Grandfather     Brain    Social History  Substance Use Topics  . Smoking status: Former Smoker    Quit date: 10/07/2010  . Smokeless tobacco: Never Used  . Alcohol Use: No     Comment: social    Allergies: No Known Allergies  Prescriptions prior to admission  Medication Sig Dispense Refill Last Dose  . Prenatal Vit-Fe Fumarate-FA (PRENATAL VITAMIN PO) Take by mouth.   Taking    Review of Systems  Constitutional: Negative.   HENT: Negative.   Eyes: Negative.   Respiratory:  Negative.   Cardiovascular: Positive for leg swelling. Negative for chest pain.  Gastrointestinal: Negative.   Genitourinary: Negative.    Physical Exam   Blood pressure 130/59, pulse 102, temperature 98.3 F (36.8 C), temperature source Oral, resp. rate 18, last menstrual period 12/26/2014.  Patient Vitals for the past 24 hrs:  BP Temp Temp src Pulse Resp  09/20/15 1610 126/73 mmHg - - 95 -  09/20/15 1600 123/73 mmHg - - 91 -  09/20/15 1525 130/59 mmHg - - 102 -  09/20/15 1524 130/59 mmHg 98.3 F (36.8 C) Oral 102 18     Physical Exam  Nursing note and vitals  reviewed. Constitutional: She is oriented to person, place, and time. She appears well-developed and well-nourished. No distress.  HENT:  Head: Normocephalic and atraumatic.  Eyes: Conjunctivae are normal. Right eye exhibits no discharge. Left eye exhibits no discharge. No scleral icterus.  Neck: Normal range of motion.  Cardiovascular: Normal rate, regular rhythm and normal heart sounds.   No murmur heard. Respiratory: Effort normal and breath sounds normal. No respiratory distress. She has no wheezes.  GI: Soft.  Musculoskeletal: She exhibits edema.       Right ankle: She exhibits swelling (pitting edema in BLE).       Left ankle: She exhibits swelling.  Neurological: She is alert and oriented to person, place, and time.  Skin: Skin is warm and dry. She is not diaphoretic.  Psychiatric: She has a normal mood and affect. Her behavior is normal. Judgment and thought content normal.    Fetal Tracing:  Baseline: 140 Variability: moderate Accelerations: 15x15 Decelerations: none  Toco: UI   MAU Course  Procedures Results for orders placed or performed during the hospital encounter of 09/20/15 (from the past 24 hour(s))  Protein / creatinine ratio, urine     Status: Abnormal   Collection Time: 09/20/15  3:15 PM  Result Value Ref Range   Creatinine, Urine 275.00 mg/dL   Total Protein, Urine 74 mg/dL   Protein  Creatinine Ratio 0.27 (H) 0.00 - 0.15 mg/mg[Cre]  CBC     Status: Abnormal   Collection Time: 09/20/15  3:32 PM  Result Value Ref Range   WBC 9.6 4.0 - 10.5 K/uL   RBC 3.64 (L) 3.87 - 5.11 MIL/uL   Hemoglobin 10.9 (L) 12.0 - 15.0 g/dL   HCT 96.0 (L) 45.4 - 09.8 %   MCV 91.2 78.0 - 100.0 fL   MCH 29.9 26.0 - 34.0 pg   MCHC 32.8 30.0 - 36.0 g/dL   RDW 11.9 14.7 - 82.9 %   Platelets 148 (L) 150 - 400 K/uL  Comprehensive metabolic panel     Status: Abnormal   Collection Time: 09/20/15  3:32 PM  Result Value Ref Range   Sodium 134 (L) 135 - 145 mmol/L   Potassium 3.5 3.5 - 5.1 mmol/L   Chloride 103 101 - 111 mmol/L   CO2 23 22 - 32 mmol/L   Glucose, Bld 103 (H) 65 - 99 mg/dL   BUN 11 6 - 20 mg/dL   Creatinine, Ser 5.62 0.44 - 1.00 mg/dL   Calcium 8.6 (L) 8.9 - 10.3 mg/dL   Total Protein 5.7 (L) 6.5 - 8.1 g/dL   Albumin 2.5 (L) 3.5 - 5.0 g/dL   AST 21 15 - 41 U/L   ALT 18 14 - 54 U/L   Alkaline Phosphatase 190 (H) 38 - 126 U/L   Total Bilirubin 0.7 0.3 - 1.2 mg/dL   GFR calc non Af Amer >60 >60 mL/min   GFR calc Af Amer >60 >60 mL/min   Anion gap 8 5 - 15  Lactate dehydrogenase     Status: None   Collection Time: 09/20/15  3:32 PM  Result Value Ref Range   LDH 177 98 - 192 U/L  Uric acid     Status: None   Collection Time: 09/20/15  3:32 PM  Result Value Ref Range   Uric Acid, Serum 3.4 2.3 - 6.6 mg/dL    MDM Category 1 tracing Normotensive while in MAU Patient scheduled to return for pre op appointment on Friday which will include BP check  Assessment and Plan  A: 1. Proteinuria affecting pregnancy   2. Labile hypertension    P: Discharge home Discussed reasons to return to MAU Keep scheduled appointments Pre E s/s & fetal kick count form given   Judeth HornErin Aprill Banko, NP  09/20/2015, 3:49 PM

## 2015-09-20 NOTE — MAU Note (Signed)
Pt was sent from the office because she had high blood pressure, swelling, and protein in her urine.

## 2015-09-20 NOTE — Discharge Instructions (Signed)
Hypertension During Pregnancy °Hypertension, or high blood pressure, is when there is extra pressure inside your blood vessels that carry blood from the heart to the rest of your body (arteries). It can happen at any time in life, including pregnancy. Hypertension during pregnancy can cause problems for you and your baby. Your baby might not weigh as much as he or she should at birth or might be born early (premature). Very bad cases of hypertension during pregnancy can be life-threatening.  °Different types of hypertension can occur during pregnancy. These include: °· Chronic hypertension. This happens when a woman has hypertension before pregnancy and it continues during pregnancy. °· Gestational hypertension. This is when hypertension develops during pregnancy. °· Preeclampsia or toxemia of pregnancy. This is a very serious type of hypertension that develops only during pregnancy. It affects the whole body and can be very dangerous for both mother and baby.   °Gestational hypertension and preeclampsia usually go away after your baby is born. Your blood pressure will likely stabilize within 6 weeks. Women who have hypertension during pregnancy have a greater chance of developing hypertension later in life or with future pregnancies. °RISK FACTORS °There are certain factors that make it more likely for you to develop hypertension during pregnancy. These include: °1. Having hypertension before pregnancy. °2. Having hypertension during a previous pregnancy. °3. Being overweight. °4. Being older than 40 years. °5. Being pregnant with more than one baby. °6. Having diabetes or kidney problems. °SIGNS AND SYMPTOMS °Chronic and gestational hypertension rarely cause symptoms. Preeclampsia has symptoms, which may include: °· Increased protein in your urine. Your health care provider will check for this at every prenatal visit. °· Swelling of your hands and face. °· Rapid weight gain. °· Headaches. °· Visual  changes. °· Being bothered by light. °· Abdominal pain, especially in the upper right area. °· Chest pain. °· Shortness of breath. °· Increased reflexes. °· Seizures. These occur with a more severe form of preeclampsia, called eclampsia. °DIAGNOSIS  °You may be diagnosed with hypertension during a regular prenatal exam. At each prenatal visit, you may have: °· Your blood pressure checked. °· A urine test to check for protein in your urine. °The type of hypertension you are diagnosed with depends on when you developed it. It also depends on your specific blood pressure reading. °· Developing hypertension before 20 weeks of pregnancy is consistent with chronic hypertension. °· Developing hypertension after 20 weeks of pregnancy is consistent with gestational hypertension. °· Hypertension with increased urinary protein is diagnosed as preeclampsia. °· Blood pressure measurements that stay above 160 systolic or 110 diastolic are a sign of severe preeclampsia. °TREATMENT °Treatment for hypertension during pregnancy varies. Treatment depends on the type of hypertension and how serious it is. °· If you take medicine for chronic hypertension, you may need to switch medicines. °¨ Medicines called ACE inhibitors should not be taken during pregnancy. °¨ Low-dose aspirin may be suggested for women who have risk factors for preeclampsia. °· If you have gestational hypertension, you may need to take a blood pressure medicine that is safe during pregnancy. Your health care provider will recommend the correct medicine. °· If you have severe preeclampsia, you may need to be in the hospital. Health care providers will watch you and your baby very closely. You also may need to take medicine called magnesium sulfate to prevent seizures and lower blood pressure. °· Sometimes, an early delivery is needed. This may be the case if the condition worsens. It would be   done to protect you and your baby. The only cure for preeclampsia is  delivery.  Your health care provider may recommend that you take one low-dose aspirin (81 mg) each day to help prevent high blood pressure during your pregnancy if you are at risk for preeclampsia. You may be at risk for preeclampsia if:  You had preeclampsia or eclampsia during a previous pregnancy.  Your baby did not grow as expected during a previous pregnancy.  You experienced preterm birth with a previous pregnancy.  You experienced a separation of the placenta from the uterus (placental abruption) during a previous pregnancy.  You experienced the loss of your baby during a previous pregnancy.  You are pregnant with more than one baby.  You have other medical conditions, such as diabetes or an autoimmune disease. HOME CARE INSTRUCTIONS  Schedule and keep all of your regular prenatal care appointments. This is important.  Take medicines only as directed by your health care provider. Tell your health care provider about all medicines you take.  Eat as little salt as possible.  Get regular exercise.  Do not drink alcohol.  Do not use tobacco products.  Do not drink products with caffeine.  Lie on your left side when resting. SEEK IMMEDIATE MEDICAL CARE IF:  You have severe abdominal pain.  You have sudden swelling in your hands or face.  You gain 4 pounds (1.8 kg) or more in 1 week.  You vomit repeatedly.  You have vaginal bleeding.  You do not feel your baby moving as much.  You have a headache.  You have blurred or double vision.  You have muscle twitching or spasms.  You have shortness of breath.  You have blue fingernails or lips.  You have blood in your urine. MAKE SURE YOU:  Understand these instructions.  Will watch your condition.  Will get help right away if you are not doing well or get worse.   This information is not intended to replace advice given to you by your health care provider. Make sure you discuss any questions you have with  your health care provider.   Document Released: 06/11/2011 Document Revised: 10/14/2014 Document Reviewed: 04/22/2013 Elsevier Interactive Patient Education 2016 [employer]   Fetal Movement Counts Patient Name: __________________________________________________ Patient Due Date: ____________________ Performing a fetal movement count is highly recommended in high-risk pregnancies, but it is good for every pregnant woman to do. Your health care provider may ask you to start counting fetal movements at 28 weeks of the pregnancy. Fetal movements often increase:  After eating a full meal.  After physical activity.  After eating or drinking something sweet or cold.  At rest. Pay attention to when you feel the baby is most active. This will help you notice a pattern of your baby's sleep and wake cycles and what factors contribute to an increase in fetal movement. It is important to perform a fetal movement count at the same time each day when your baby is normally most active.  HOW TO COUNT FETAL MOVEMENTS 7. Find a quiet and comfortable area to sit or lie down on your left side. Lying on your left side provides the best blood and oxygen circulation to your baby. 8. Write down the day and time on a sheet of paper or in a journal. 9. Start counting kicks, flutters, swishes, rolls, or jabs in a 2-hour period. You should feel at least 10 movements within 2 hours. 10. If you do not feel 10 movements in  2 hours, wait 2-3 hours and count again. Look for a change in the pattern or not enough counts in 2 hours. °SEEK MEDICAL CARE IF: °· You feel less than 10 counts in 2 hours, tried twice. °· There is no movement in over an hour. °· The pattern is changing or taking longer each day to reach 10 counts in 2 hours. °· You feel the baby is not moving as he or she usually does. °Date: ____________ Movements: ____________ Start time: ____________ Finish time: ____________  °Date: ____________ Movements:  ____________ Start time: ____________ Finish time: ____________ °Date: ____________ Movements: ____________ Start time: ____________ Finish time: ____________ °Date: ____________ Movements: ____________ Start time: ____________ Finish time: ____________ °Date: ____________ Movements: ____________ Start time: ____________ Finish time: ____________ °Date: ____________ Movements: ____________ Start time: ____________ Finish time: ____________ °Date: ____________ Movements: ____________ Start time: ____________ Finish time: ____________ °Date: ____________ Movements: ____________ Start time: ____________ Finish time: ____________  °Date: ____________ Movements: ____________ Start time: ____________ Finish time: ____________ °Date: ____________ Movements: ____________ Start time: ____________ Finish time: ____________ °Date: ____________ Movements: ____________ Start time: ____________ Finish time: ____________ °Date: ____________ Movements: ____________ Start time: ____________ Finish time: ____________ °Date: ____________ Movements: ____________ Start time: ____________ Finish time: ____________ °Date: ____________ Movements: ____________ Start time: ____________ Finish time: ____________ °Date: ____________ Movements: ____________ Start time: ____________ Finish time: ____________  °Date: ____________ Movements: ____________ Start time: ____________ Finish time: ____________ °Date: ____________ Movements: ____________ Start time: ____________ Finish time: ____________ °Date: ____________ Movements: ____________ Start time: ____________ Finish time: ____________ °Date: ____________ Movements: ____________ Start time: ____________ Finish time: ____________ °Date: ____________ Movements: ____________ Start time: ____________ Finish time: ____________ °Date: ____________ Movements: ____________ Start time: ____________ Finish time: ____________ °Date: ____________ Movements: ____________ Start time: ____________ Finish  time: ____________  °Date: ____________ Movements: ____________ Start time: ____________ Finish time: ____________ °Date: ____________ Movements: ____________ Start time: ____________ Finish time: ____________ °Date: ____________ Movements: ____________ Start time: ____________ Finish time: ____________ °Date: ____________ Movements: ____________ Start time: ____________ Finish time: ____________ °Date: ____________ Movements: ____________ Start time: ____________ Finish time: ____________ °Date: ____________ Movements: ____________ Start time: ____________ Finish time: ____________ °Date: ____________ Movements: ____________ Start time: ____________ Finish time: ____________  °Date: ____________ Movements: ____________ Start time: ____________ Finish time: ____________ °Date: ____________ Movements: ____________ Start time: ____________ Finish time: ____________ °Date: ____________ Movements: ____________ Start time: ____________ Finish time: ____________ °Date: ____________ Movements: ____________ Start time: ____________ Finish time: ____________ °Date: ____________ Movements: ____________ Start time: ____________ Finish time: ____________ °Date: ____________ Movements: ____________ Start time: ____________ Finish time: ____________ °Date: ____________ Movements: ____________ Start time: ____________ Finish time: ____________  °Date: ____________ Movements: ____________ Start time: ____________ Finish time: ____________ °Date: ____________ Movements: ____________ Start time: ____________ Finish time: ____________ °Date: ____________ Movements: ____________ Start time: ____________ Finish time: ____________ °Date: ____________ Movements: ____________ Start time: ____________ Finish time: ____________ °Date: ____________ Movements: ____________ Start time: ____________ Finish time: ____________ °Date: ____________ Movements: ____________ Start time: ____________ Finish time: ____________ °Date: ____________  Movements: ____________ Start time: ____________ Finish time: ____________  °Date: ____________ Movements: ____________ Start time: ____________ Finish time: ____________ °Date: ____________ Movements: ____________ Start time: ____________ Finish time: ____________ °Date: ____________ Movements: ____________ Start time: ____________ Finish time: ____________ °Date: ____________ Movements: ____________ Start time: ____________ Finish time: ____________ °Date: ____________ Movements: ____________ Start time: ____________ Finish time: ____________ °Date: ____________ Movements: ____________ Start time: ____________ Finish time: ____________ °Date: ____________ Movements: ____________ Start time: ____________ Finish time: ____________  °Date: ____________ Movements: ____________ Start time: ____________ Finish time: ____________ °Date:   ____________ Movements: ____________ Start time: ____________ Finish time: ____________ °Date: ____________ Movements: ____________ Start time: ____________ Finish time: ____________ °Date: ____________ Movements: ____________ Start time: ____________ Finish time: ____________ °Date: ____________ Movements: ____________ Start time: ____________ Finish time: ____________ °Date: ____________ Movements: ____________ Start time: ____________ Finish time: ____________ °  °This information is not intended to replace advice given to you by your health care provider. Make sure you discuss any questions you have with your health care provider. °  °Document Released: 10/23/2006 Document Revised: 10/14/2014 Document Reviewed: 07/20/2012 °Elsevier Interactive Patient Education ©2016 [employer] ° °

## 2015-09-21 ENCOUNTER — Encounter: Payer: Self-pay | Admitting: *Deleted

## 2015-09-22 ENCOUNTER — Encounter (HOSPITAL_COMMUNITY): Payer: Self-pay | Admitting: Anesthesiology

## 2015-09-22 ENCOUNTER — Inpatient Hospital Stay (HOSPITAL_COMMUNITY): Admission: RE | Admit: 2015-09-22 | Payer: Commercial Indemnity | Source: Ambulatory Visit

## 2015-09-22 ENCOUNTER — Other Ambulatory Visit: Payer: Commercial Indemnity | Admitting: *Deleted

## 2015-09-22 ENCOUNTER — Other Ambulatory Visit: Payer: Commercial Indemnity

## 2015-09-22 NOTE — Anesthesia Preprocedure Evaluation (Addendum)
Anesthesia Evaluation  Patient identified by MRN, date of birth, ID band Patient awake    Reviewed: Allergy & Precautions, NPO status , Patient's Chart, lab work & pertinent test results  Airway Mallampati: III  TM Distance: >3 FB Neck ROM: Full    Dental no notable dental hx. (+) Teeth Intact   Pulmonary neg pulmonary ROS, former smoker,    Pulmonary exam normal breath sounds clear to auscultation       Cardiovascular negative cardio ROS Normal cardiovascular exam Rhythm:Regular Rate:Normal     Neuro/Psych PSYCHIATRIC DISORDERS Depression negative neurological ROS     GI/Hepatic Neg liver ROS, GERD  Medicated and Controlled,  Endo/Other  Obesity   Renal/GU negative Renal ROS  negative genitourinary   Musculoskeletal negative musculoskeletal ROS (+)   Abdominal (+) + obese,   Peds  Hematology negative hematology ROS (+)   Anesthesia Other Findings   Reproductive/Obstetrics (+) Pregnancy Previous C/Section Undesired fertility                             Anesthesia Physical Anesthesia Plan  ASA: II  Anesthesia Plan: Spinal   Post-op Pain Management:    Induction:   Airway Management Planned: Natural Airway  Additional Equipment:   Intra-op Plan:   Post-operative Plan:   Informed Consent: I have reviewed the patients History and Physical, chart, labs and discussed the procedure including the risks, benefits and alternatives for the proposed anesthesia with the patient or authorized representative who has indicated his/her understanding and acceptance.     Plan Discussed with: Anesthesiologist, CRNA and Surgeon  Anesthesia Plan Comments:         Anesthesia Quick Evaluation

## 2015-09-22 NOTE — Progress Notes (Signed)
Pt seen in MAU due to increased BP and swelling in her feet.  Here to follow-up with BP reading.  BP 117/79.  Pt states she had contractions this morning for about an hour but has stopped at this point.  Reviewed labor precautions with the patient and preeclampsia symptoms. Pt to go to MAU over the weekend with any changes or regular contraction patterns.

## 2015-09-25 ENCOUNTER — Encounter (HOSPITAL_COMMUNITY)
Admission: RE | Disposition: A | Discharge status: Home / Self Care | Payer: Self-pay | Source: Ambulatory Visit | Attending: Obstetrics and Gynecology

## 2015-09-25 ENCOUNTER — Inpatient Hospital Stay (HOSPITAL_COMMUNITY): Payer: Managed Care, Other (non HMO) | Admitting: Anesthesiology

## 2015-09-25 ENCOUNTER — Inpatient Hospital Stay (HOSPITAL_COMMUNITY)
Admission: RE | Admit: 2015-09-25 | Discharge: 2015-09-27 | DRG: 766 | Disposition: A | Discharge status: Home / Self Care | Payer: Managed Care, Other (non HMO) | Source: Ambulatory Visit | Attending: Obstetrics and Gynecology | Admitting: Obstetrics and Gynecology

## 2015-09-25 ENCOUNTER — Encounter (HOSPITAL_COMMUNITY): Payer: Self-pay | Admitting: Emergency Medicine

## 2015-09-25 DIAGNOSIS — O34219 Maternal care for unspecified type scar from previous cesarean delivery: Secondary | ICD-10-CM | POA: Diagnosis present

## 2015-09-25 DIAGNOSIS — F329 Major depressive disorder, single episode, unspecified: Secondary | ICD-10-CM | POA: Diagnosis present

## 2015-09-25 DIAGNOSIS — Z87891 Personal history of nicotine dependence: Secondary | ICD-10-CM

## 2015-09-25 DIAGNOSIS — Z302 Encounter for sterilization: Secondary | ICD-10-CM | POA: Diagnosis not present

## 2015-09-25 DIAGNOSIS — Z3483 Encounter for supervision of other normal pregnancy, third trimester: Secondary | ICD-10-CM | POA: Diagnosis present

## 2015-09-25 DIAGNOSIS — Z3480 Encounter for supervision of other normal pregnancy, unspecified trimester: Secondary | ICD-10-CM

## 2015-09-25 DIAGNOSIS — O99344 Other mental disorders complicating childbirth: Secondary | ICD-10-CM | POA: Diagnosis present

## 2015-09-25 DIAGNOSIS — F32A Depression, unspecified: Secondary | ICD-10-CM

## 2015-09-25 DIAGNOSIS — O34211 Maternal care for low transverse scar from previous cesarean delivery: Secondary | ICD-10-CM | POA: Diagnosis present

## 2015-09-25 DIAGNOSIS — Z3A39 39 weeks gestation of pregnancy: Secondary | ICD-10-CM

## 2015-09-25 DIAGNOSIS — Z98891 History of uterine scar from previous surgery: Secondary | ICD-10-CM

## 2015-09-25 DIAGNOSIS — Z9851 Tubal ligation status: Secondary | ICD-10-CM

## 2015-09-25 DIAGNOSIS — O1210 Gestational proteinuria, unspecified trimester: Secondary | ICD-10-CM | POA: Diagnosis present

## 2015-09-25 LAB — ABO/RH: ABO/RH(D): A POS

## 2015-09-25 LAB — TYPE AND SCREEN
ABO/RH(D): A POS
ANTIBODY SCREEN: NEGATIVE

## 2015-09-25 LAB — CBC
HEMATOCRIT: 33.1 % — AB (ref 36.0–46.0)
Hemoglobin: 10.8 g/dL — ABNORMAL LOW (ref 12.0–15.0)
MCH: 30.1 pg (ref 26.0–34.0)
MCHC: 32.6 g/dL (ref 30.0–36.0)
MCV: 92.2 fL (ref 78.0–100.0)
PLATELETS: 136 10*3/uL — AB (ref 150–400)
RBC: 3.59 MIL/uL — ABNORMAL LOW (ref 3.87–5.11)
RDW: 13.2 % (ref 11.5–15.5)
WBC: 10.9 10*3/uL — AB (ref 4.0–10.5)

## 2015-09-25 LAB — RPR: RPR: NONREACTIVE

## 2015-09-25 SURGERY — Surgical Case
Anesthesia: Spinal | Site: Abdomen | Laterality: Bilateral

## 2015-09-25 MED ORDER — OXYCODONE-ACETAMINOPHEN 5-325 MG PO TABS
1.0000 | ORAL_TABLET | ORAL | Status: DC | PRN
  Administered 2015-09-26 – 2015-09-27 (×4): 1 via ORAL
  Filled 2015-09-25 (×4): qty 1

## 2015-09-25 MED ORDER — DIPHENHYDRAMINE HCL 50 MG/ML IJ SOLN: 12.5000 mg | INTRAMUSCULAR | Status: DC | PRN

## 2015-09-25 MED ORDER — 0.9 % SODIUM CHLORIDE (POUR BTL) OPTIME
TOPICAL | Status: DC | PRN
  Administered 2015-09-25: 1000 mL

## 2015-09-25 MED ORDER — BUPIVACAINE IN DEXTROSE 0.75-8.25 % IT SOLN
INTRATHECAL | Status: DC | PRN
  Administered 2015-09-25: 1.4 mL via INTRATHECAL

## 2015-09-25 MED ORDER — NALBUPHINE HCL 10 MG/ML IJ SOLN: 5.0000 mg | Freq: Once | INTRAMUSCULAR | Status: DC | PRN

## 2015-09-25 MED ORDER — CEFAZOLIN SODIUM-DEXTROSE 2-3 GM-% IV SOLR
2.0000 g | INTRAVENOUS | Status: AC
  Administered 2015-09-25: 2 g via INTRAVENOUS

## 2015-09-25 MED ORDER — IBUPROFEN 600 MG PO TABS
600.0000 mg | ORAL_TABLET | Freq: Four times a day (QID) | ORAL | Status: DC | PRN
  Administered 2015-09-25 – 2015-09-26 (×2): 600 mg via ORAL

## 2015-09-25 MED ORDER — OXYTOCIN 10 UNIT/ML IJ SOLN
INTRAMUSCULAR | Status: AC
  Filled 2015-09-25: qty 4

## 2015-09-25 MED ORDER — CEFAZOLIN SODIUM-DEXTROSE 2-3 GM-% IV SOLR
INTRAVENOUS | Status: AC
  Filled 2015-09-25: qty 50

## 2015-09-25 MED ORDER — TETANUS-DIPHTH-ACELL PERTUSSIS 5-2.5-18.5 LF-MCG/0.5 IM SUSP: 0.5000 mL | Freq: Once | INTRAMUSCULAR | Status: DC

## 2015-09-25 MED ORDER — LACTATED RINGERS IV SOLN
INTRAVENOUS | Status: DC | PRN
  Administered 2015-09-25 (×3): via INTRAVENOUS

## 2015-09-25 MED ORDER — ONDANSETRON HCL 4 MG/2ML IJ SOLN
INTRAMUSCULAR | Status: AC
  Filled 2015-09-25: qty 2

## 2015-09-25 MED ORDER — DIPHENHYDRAMINE HCL 25 MG PO CAPS: 25.0000 mg | ORAL_CAPSULE | Freq: Four times a day (QID) | ORAL | Status: DC | PRN

## 2015-09-25 MED ORDER — NALBUPHINE HCL 10 MG/ML IJ SOLN: 5.0000 mg | INTRAMUSCULAR | Status: DC | PRN

## 2015-09-25 MED ORDER — KETOROLAC TROMETHAMINE 30 MG/ML IJ SOLN: 30.0000 mg | Freq: Four times a day (QID) | INTRAMUSCULAR | Status: AC | PRN

## 2015-09-25 MED ORDER — DIPHENHYDRAMINE HCL 25 MG PO CAPS
25.0000 mg | ORAL_CAPSULE | ORAL | Status: DC | PRN
  Filled 2015-09-25: qty 1

## 2015-09-25 MED ORDER — ONDANSETRON HCL 4 MG/2ML IJ SOLN: 4.0000 mg | Freq: Three times a day (TID) | INTRAMUSCULAR | Status: DC | PRN

## 2015-09-25 MED ORDER — OXYTOCIN 10 UNIT/ML IJ SOLN
40.0000 [IU] | INTRAMUSCULAR | Status: DC | PRN
  Administered 2015-09-25: 40 [IU] via INTRAVENOUS

## 2015-09-25 MED ORDER — OXYTOCIN 40 UNITS IN LACTATED RINGERS INFUSION - SIMPLE MED: 62.5000 mL/h | INTRAVENOUS | Status: AC

## 2015-09-25 MED ORDER — FENTANYL CITRATE (PF) 100 MCG/2ML IJ SOLN
INTRAMUSCULAR | Status: AC
  Filled 2015-09-25: qty 2

## 2015-09-25 MED ORDER — LANOLIN HYDROUS EX OINT
1.0000 | TOPICAL_OINTMENT | CUTANEOUS | Status: DC | PRN
Start: 2015-09-25 — End: 2015-09-27

## 2015-09-25 MED ORDER — ACETAMINOPHEN 325 MG PO TABS
650.0000 mg | ORAL_TABLET | ORAL | Status: DC | PRN
  Administered 2015-09-25 (×2): 650 mg via ORAL
  Filled 2015-09-25 (×2): qty 2

## 2015-09-25 MED ORDER — ONDANSETRON HCL 4 MG/2ML IJ SOLN
INTRAMUSCULAR | Status: DC | PRN
  Administered 2015-09-25: 4 mg via INTRAVENOUS

## 2015-09-25 MED ORDER — SENNOSIDES-DOCUSATE SODIUM 8.6-50 MG PO TABS
2.0000 | ORAL_TABLET | ORAL | Status: DC
  Administered 2015-09-25 – 2015-09-26 (×2): 2 via ORAL
  Filled 2015-09-25 (×2): qty 2

## 2015-09-25 MED ORDER — SIMETHICONE 80 MG PO CHEW: 80.0000 mg | CHEWABLE_TABLET | ORAL | Status: DC | PRN

## 2015-09-25 MED ORDER — WITCH HAZEL-GLYCERIN EX PADS: 1.0000 "application " | MEDICATED_PAD | CUTANEOUS | Status: DC | PRN

## 2015-09-25 MED ORDER — SIMETHICONE 80 MG PO CHEW
80.0000 mg | CHEWABLE_TABLET | Freq: Three times a day (TID) | ORAL | Status: DC
  Administered 2015-09-25 – 2015-09-27 (×5): 80 mg via ORAL
  Filled 2015-09-25 (×5): qty 1

## 2015-09-25 MED ORDER — SCOPOLAMINE 1 MG/3DAYS TD PT72: 1.0000 | MEDICATED_PATCH | Freq: Once | TRANSDERMAL | Status: DC

## 2015-09-25 MED ORDER — SCOPOLAMINE 1 MG/3DAYS TD PT72
MEDICATED_PATCH | TRANSDERMAL | Status: AC
  Filled 2015-09-25: qty 1

## 2015-09-25 MED ORDER — PRENATAL MULTIVITAMIN CH
1.0000 | ORAL_TABLET | Freq: Every day | ORAL | Status: DC
  Administered 2015-09-26 – 2015-09-27 (×2): 1 via ORAL
  Filled 2015-09-25 (×2): qty 1

## 2015-09-25 MED ORDER — ERYTHROMYCIN 5 MG/GM OP OINT
TOPICAL_OINTMENT | OPHTHALMIC | Status: AC
  Filled 2015-09-25: qty 1

## 2015-09-25 MED ORDER — IBUPROFEN 600 MG PO TABS
600.0000 mg | ORAL_TABLET | Freq: Four times a day (QID) | ORAL | Status: DC
  Administered 2015-09-25 – 2015-09-27 (×6): 600 mg via ORAL
  Filled 2015-09-25 (×8): qty 1

## 2015-09-25 MED ORDER — PHENYLEPHRINE 8 MG IN D5W 100 ML (0.08MG/ML) PREMIX OPTIME
INJECTION | INTRAVENOUS | Status: DC | PRN
  Administered 2015-09-25: 60 ug/min via INTRAVENOUS

## 2015-09-25 MED ORDER — FENTANYL CITRATE (PF) 100 MCG/2ML IJ SOLN
INTRAMUSCULAR | Status: AC
  Administered 2015-09-25: 50 ug via INTRAVENOUS
  Filled 2015-09-25: qty 2

## 2015-09-25 MED ORDER — OXYCODONE-ACETAMINOPHEN 5-325 MG PO TABS
2.0000 | ORAL_TABLET | ORAL | Status: DC | PRN
  Administered 2015-09-26: 2 via ORAL
  Filled 2015-09-25: qty 2

## 2015-09-25 MED ORDER — MORPHINE SULFATE (PF) 0.5 MG/ML IJ SOLN
INTRAMUSCULAR | Status: DC | PRN
  Administered 2015-09-25: .2 mg via INTRATHECAL

## 2015-09-25 MED ORDER — FENTANYL CITRATE (PF) 100 MCG/2ML IJ SOLN
INTRAMUSCULAR | Status: DC | PRN
  Administered 2015-09-25: 20 ug via INTRATHECAL

## 2015-09-25 MED ORDER — LACTATED RINGERS IV SOLN
Freq: Once | INTRAVENOUS | Status: AC
  Administered 2015-09-25: 10:00:00 via INTRAVENOUS

## 2015-09-25 MED ORDER — SODIUM CHLORIDE 0.9 % IJ SOLN: 3.0000 mL | INTRAMUSCULAR | Status: DC | PRN

## 2015-09-25 MED ORDER — DIBUCAINE 1 % RE OINT: 1.0000 "application " | TOPICAL_OINTMENT | RECTAL | Status: DC | PRN

## 2015-09-25 MED ORDER — NALOXONE HCL 0.4 MG/ML IJ SOLN: 0.4000 mg | INTRAMUSCULAR | Status: DC | PRN

## 2015-09-25 MED ORDER — ZOLPIDEM TARTRATE 5 MG PO TABS: 5.0000 mg | ORAL_TABLET | Freq: Every evening | ORAL | Status: DC | PRN

## 2015-09-25 MED ORDER — LACTATED RINGERS IV SOLN
INTRAVENOUS | Status: DC
  Administered 2015-09-25: 22:00:00 via INTRAVENOUS

## 2015-09-25 MED ORDER — SIMETHICONE 80 MG PO CHEW
80.0000 mg | CHEWABLE_TABLET | ORAL | Status: DC
  Administered 2015-09-25 – 2015-09-26 (×2): 80 mg via ORAL
  Filled 2015-09-25 (×2): qty 1

## 2015-09-25 MED ORDER — DEXTROSE 5 % IV SOLN
1.0000 ug/kg/h | INTRAVENOUS | Status: DC | PRN
  Filled 2015-09-25: qty 2

## 2015-09-25 MED ORDER — MENTHOL 3 MG MT LOZG: 1.0000 | LOZENGE | OROMUCOSAL | Status: DC | PRN

## 2015-09-25 MED ORDER — PHENYLEPHRINE 8 MG IN D5W 100 ML (0.08MG/ML) PREMIX OPTIME
INJECTION | INTRAVENOUS | Status: AC
  Filled 2015-09-25: qty 100

## 2015-09-25 MED ORDER — FENTANYL CITRATE (PF) 100 MCG/2ML IJ SOLN
25.0000 ug | INTRAMUSCULAR | Status: DC | PRN
  Administered 2015-09-25: 25 ug via INTRAVENOUS
  Administered 2015-09-25: 50 ug via INTRAVENOUS

## 2015-09-25 MED ORDER — MEPERIDINE HCL 25 MG/ML IJ SOLN: 6.2500 mg | INTRAMUSCULAR | Status: DC | PRN

## 2015-09-25 MED ORDER — MORPHINE SULFATE (PF) 0.5 MG/ML IJ SOLN
INTRAMUSCULAR | Status: AC
  Filled 2015-09-25: qty 10

## 2015-09-25 SURGICAL SUPPLY — 33 items
BENZOIN TINCTURE PRP APPL 2/3 (GAUZE/BANDAGES/DRESSINGS) ×3 IMPLANT
CLAMP CORD UMBIL (MISCELLANEOUS) IMPLANT
CLOSURE WOUND 1/2 X4 (GAUZE/BANDAGES/DRESSINGS) ×1
CONTAINER PREFILL 10% NBF 15ML (MISCELLANEOUS) IMPLANT
DRAPE SHEET LG 3/4 BI-LAMINATE (DRAPES) IMPLANT
DRSG OPSITE POSTOP 4X10 (GAUZE/BANDAGES/DRESSINGS) ×3 IMPLANT
DURAPREP 26ML APPLICATOR (WOUND CARE) ×3 IMPLANT
ELECT REM PT RETURN 9FT ADLT (ELECTROSURGICAL) ×3
ELECTRODE REM PT RTRN 9FT ADLT (ELECTROSURGICAL) ×1 IMPLANT
EXTRACTOR VACUUM M CUP 4 TUBE (SUCTIONS) ×2 IMPLANT
EXTRACTOR VACUUM M CUP 4' TUBE (SUCTIONS) ×1
GAUZE SPONGE 4X4 16PLY XRAY LF (GAUZE/BANDAGES/DRESSINGS) ×3 IMPLANT
GLOVE BIOGEL PI IND STRL 6.5 (GLOVE) ×1 IMPLANT
GLOVE BIOGEL PI IND STRL 7.0 (GLOVE) ×1 IMPLANT
GLOVE BIOGEL PI INDICATOR 6.5 (GLOVE) ×2
GLOVE BIOGEL PI INDICATOR 7.0 (GLOVE) ×2
GLOVE SURG SS PI 6.0 STRL IVOR (GLOVE) ×3 IMPLANT
GOWN STRL REUS W/TWL LRG LVL3 (GOWN DISPOSABLE) ×6 IMPLANT
KIT ABG SYR 3ML LUER SLIP (SYRINGE) IMPLANT
NEEDLE HYPO 25X5/8 SAFETYGLIDE (NEEDLE) IMPLANT
NS IRRIG 1000ML POUR BTL (IV SOLUTION) ×3 IMPLANT
PACK C SECTION WH (CUSTOM PROCEDURE TRAY) ×3 IMPLANT
PAD ABD 8X7 1/2 STERILE (GAUZE/BANDAGES/DRESSINGS) ×3 IMPLANT
PAD OB MATERNITY 4.3X12.25 (PERSONAL CARE ITEMS) ×3 IMPLANT
PENCIL SMOKE EVAC W/HOLSTER (ELECTROSURGICAL) ×3 IMPLANT
RTRCTR C-SECT PINK 25CM LRG (MISCELLANEOUS) IMPLANT
SEPRAFILM MEMBRANE 5X6 (MISCELLANEOUS) IMPLANT
STRIP CLOSURE SKIN 1/2X4 (GAUZE/BANDAGES/DRESSINGS) ×2 IMPLANT
SUT PLAIN 0 NONE (SUTURE) ×3 IMPLANT
SUT VIC AB 0 CT1 36 (SUTURE) ×12 IMPLANT
SUT VIC AB 4-0 KS 27 (SUTURE) ×3 IMPLANT
TOWEL OR 17X24 6PK STRL BLUE (TOWEL DISPOSABLE) ×3 IMPLANT
TRAY FOLEY CATH SILVER 14FR (SET/KITS/TRAYS/PACK) ×3 IMPLANT

## 2015-09-25 NOTE — H&P (Signed)
Theresa Coleman is a 34 y.o. female G2P1001 at 1939 weeks presenting for scheduled repeat cesarean section and bilateral tubal ligation. [facility] since 8 weeks of pregnancy. Prenatal care complicated by a previous cesarean section secondary to failure to progress. Patient is currently without complaints.   History OB History    Gravida Para Term Preterm AB TAB SAB Ectopic Multiple Living   2 1 1       1      Past Medical History  Diagnosis Date  . Supervision of other normal pregnancy, antepartum 02/22/2015     Clinic  Prenatal Labs Dating  Blood type:    Genetic Screen 1 Screen:    AFP:     Quad:     NIPS: Antibody:  Anatomic US  Rubella:   GTT Early:               Third trimester:  RPR:    Flu vaccine  HBsAg:    TDaP vaccine                                               Rhogam: HIV:    GBS                                              (For PCN allergy, check sensitivities) GBS:  Contraception  Pap: Ba   Past Surgical History  Procedure Laterality Date  . Cesarean section     Family History: family history includes Cancer in her maternal grandfather and maternal grandmother; Stroke in her paternal grandmother. Social History:  reports that she quit smoking about 4 years ago. She has never used smokeless tobacco. She reports that she does not drink alcohol or use illicit drugs.   Prenatal Transfer Tool  Maternal Diabetes: No Genetic Screening: Normal Maternal Ultrasounds/Referrals: Normal Fetal Ultrasounds or other Referrals:  None Maternal Substance Abuse:  No Significant Maternal Medications:  None Significant Maternal Lab Results:  None Other Comments:  None  ROS    Blood pressure 120/93, pulse 87, temperature 97.9 F (36.6 C), temperature source Oral, resp. rate 16, height 5\' 2"  (1.575 m), weight 185 lb (83.915 kg), last menstrual period 12/26/2014, SpO2 100 %. Exam Physical Exam  GENERAL: Well-developed, well-nourished female in no  acute distress.  HEENT: Normocephalic, atraumatic. Sclerae anicteric.  NECK: Supple. Normal thyroid.  LUNGS: Clear to auscultation bilaterally.  HEART: Regular rate and rhythm. BREASTS: Symmetric in size. No palpable masses or lymphadenopathy, skin changes, or nipple drainage. ABDOMEN: Soft, nontender,gravid. PELVIC: Not indicated EXTREMITIES: No cyanosis, clubbing, or edema, 2+ distal pulses.  Prenatal labs: ABO, Rh: A/Positive/-- (05/18 1502) Antibody: Negative (05/18 1502) Rubella: 9.16 (05/18 1502) RPR: Non Reactive (10/04 1057)  HBsAg: Negative (05/18 1502)  HIV: Non Reactive (10/04 1057)  GBS:   negative  Assessment/Plan: 34 yo G2P1 at 39 weeks here for repeat cesarean section and permanent sterilization - Risks, benefits and alternatives were explained including but not limited to risks of bleeding, infection and damage to adjacent organs. Patient also desires permanent sterilization. Risks and benefits of procedure discussed with patient including permanence of method, bleeding, infection, injury to surrounding organs and need for additional procedures. Risk failure of 0.5-1% with increased  risk of ectopic gestation if pregnancy occurs was also discussed with patient.Patient verbalized understanding and all questions were answered.      Brandyce Dimario 09/25/2015, 8:47 AM

## 2015-09-25 NOTE — Transfer of Care (Signed)
Immediate Anesthesia Transfer of Care Note  Patient: Theresa Coleman  Procedure(s) Performed: Procedure(s): CESAREAN SECTION WITH BILATERAL TUBAL LIGATION (Bilateral)  Patient Location: PACU  Anesthesia Type:Spinal  Level of Consciousness: awake, alert , oriented and patient cooperative  Airway & Oxygen Therapy: Patient Spontanous Breathing  Post-op Assessment: Report given to RN and Post -op Vital signs reviewed and stable  Post vital signs: Reviewed and stable  Last Vitals:  Filed Vitals:   09/25/15 0828  BP: 120/93  Pulse: 87  Temp: 36.6 C  Resp: 16    Complications: No apparent anesthesia complications

## 2015-09-25 NOTE — Op Note (Signed)
Cesarean Section Operative Report  Theresa Coleman  09/25/2015  Indications: Scheduled Proceedure/Maternal Request   Pre-operative Diagnosis: cpt 59514 - REPEAT c/s and bilateral tubal ligation for undersired fertility.   Post-operative Diagnosis: Same   Surgeon: Surgeon(s) and Role:    * Catalina AntiguaPeggy Tyaira Heward, MD - Primary    * Kathrynn RunningNoah Bedford Wouk, MD - Assisting   Attending Attestation: I was present and scrubbed for the entire procedure.   Assistants: none  Anesthesia: spinal    Estimated Blood Loss: 900 ml  Total IV Fluids: 2300 ml LR  Urine Output:: 100 ml clear yellow urine  Specimens: transected fallopian tube x2  Findings: Viable female infant in cephalic presentation; Apgars 8 and 9; weight 3405 g; arterial cord pH not obtained; clear amniotic fluid; intact placenta with three vessel cord; normal uterus, fallopian tubes and ovaries bilaterally. No significant adhesive disease.  Baby condition / location:  Couplet care / Skin to Skin   Complications: no complications  Indications: Theresa Coleman is a 34 y.o. [id number] with an IUP 6568w0d presenting for elective scheduled repeat cesarean section. History c/s x1 for arrest of first stage.  The risks, benefits, complications, treatment options, and expected outcomes were discussed with the patient . The patient concurred with the proposed plan, giving informed consent. identified as Theresa Coleman and the procedure verified as C-Section Delivery.  Procedure Details:  The patient was taken back to the operative suite where spinal anesthesia was placed.  A time out was held and the above information confirmed.   After induction of anesthesia, the patient was draped and prepped in the usual sterile manner and placed in a dorsal supine position with a leftward tilt. A Pfannenstiel incision was made and carried down through the subcutaneous tissue to the fascia. Fascial incision was made and sharply extended transversely. The  fascia was separated from the underlying rectus tissue superiorly and inferiorly. The peritoneum was identified and bluntly entered and extended longitudinally. Alexis retractor was placed. A low transverse uterine incision was made and extended bluntly. Bandage scissors used to extend caudally the left side of the hysterotomy incision. Delivered from cephalic presentation with vacuum assistance (one pop-off) was a viable infant with Apgars and weight as above. The umbilical cord was clamped and cut cord blood was obtained for evaluation. Cord ph was not sent. The placenta was removed Intact and appeared normal. The uterine outline, tubes and ovaries appeared normal. The uterine incision was closed with running locked sutures of 0Vicryl with an imbricating layer of the same.   Hemostasis was observed after placement of three figure-of-eight 0 vicryl sutures. The Fallopian tubes were identified bilaterally.  The modified Pommery technique using 2-0 plain gut was used to ligate x2 on both sides a portion of each tube which was then excised. Hemostasis observed. The peritoneum was closed with 0 vicryl. The rectus muscles were examined and hemostasis observed. The fascia was then reapproximated with running sutures of 0Vicryl. No subcuticular closure. The skin was closed with 4-0Vicryl.   Instrument, sponge, and needle counts were correct prior the abdominal closure and were correct at the conclusion of the case.     Disposition: PACU - hemodynamically stable.   Maternal Condition: stable       Signed: Lavonne Chickoah B WoukMD 09/25/2015 10:39 AM   I was present for the entire length of the procedure and agree with the above operative report

## 2015-09-25 NOTE — Lactation Note (Signed)
This note was copied from the chart of Theresa Coleman. Lactation Consultation Note  Patient Name: Theresa Raelyn Ensignbigayle Worland MVHQI'OToday's Date: 09/25/2015 Reason for consult: Initial assessment   Initial consult for 1st time BF mom of 4 hour old infant. Mom's room full of visitors. Mom voiced that she feels feedings are going well and that she has no questions at this time. Enc mom to feed infant 8-12 x in 24 hours at first feeding cues. Infant born by C/S at 39 weeks weighing 7 lb 8.1 oz. Infant with 4 BF for 12-20 minutes, 1 void and 1 stool since birth. LATCH scores 7-8 by bedside RN. Enc mom to call nurses station for assistance with feeding as needed. LC Brochure given, informed on BF Support Groups, IP/OP Services and LC phone #. Mom with no questions at this time. Will follow up as needed.   Maternal Data Formula Feeding for Exclusion: No Does the patient have breastfeeding experience prior to this delivery?: No  Feeding Feeding Type: Breast Fed Length of feed: 20 min  LATCH Score/Interventions Latch: Grasps breast easily, tongue down, lips flanged, rhythmical sucking.  Audible Swallowing: A few with stimulation  Type of Nipple: Everted at rest and after stimulation  Comfort (Breast/Nipple): Soft / non-tender     Hold (Positioning): Assistance needed to correctly position infant at breast and maintain latch.  LATCH Score: 8  Lactation Tools Discussed/Used WIC Program: No   Consult Status Consult Status: Follow-up Date: 09/26/15 Follow-up type: In-patient    Silas FloodSharon S Kainoa Swoboda 09/25/2015, 2:20 PM

## 2015-09-25 NOTE — Consult Note (Signed)
[facility]  Delivery Note:  C-section       09/25/2015  9:47 AM  I was called to the operating room at the request of the patient's obstetrician (Dr. Constant) for a repeat c-section.  PRENATAL HX:  This is a 34 y/o G2P1001 at [redacted] weeks gestation who was admitted for repeat c-section. Her pregnancy has been without complications and she is GBS negative.    INTRAPARTUM HX:   Repeat c-section with AROM at delivery  DELIVERY:  Infant was vigorous at delivery, requiring no resuscitation other than standard warming, drying and stimulation.  APGARs 8 and 9.  Exam within normal limits.  After 5 minutes, baby left with nurse to assist parents with skin-to-skin care.   _____________________ Electronically Signed By: Shavonda Wiedman, MD Neonatologist  

## 2015-09-25 NOTE — Anesthesia Procedure Notes (Signed)
Spinal Patient location during procedure: OR Start time: 09/25/2015 9:28 AM Staffing Anesthesiologist: Mal AmabileFOSTER, Shyah Cadmus Performed by: anesthesiologist  Preanesthetic Checklist Completed: patient identified, site marked, surgical consent, pre-op evaluation, timeout performed, IV checked, risks and benefits discussed and monitors and equipment checked Spinal Block Patient position: sitting Prep: site prepped and draped and DuraPrep Patient monitoring: heart rate, cardiac monitor, continuous pulse ox and blood pressure Approach: midline Location: L3-4 Injection technique: single-shot Needle Needle type: Sprotte  Needle gauge: 24 G Needle length: 9 cm Needle insertion depth: 5 cm Assessment Sensory level: T4 Additional Notes Patient tolerated procedure well. Adequate sensory level.

## 2015-09-25 NOTE — Anesthesia Postprocedure Evaluation (Signed)
Anesthesia Post Note  Patient: Theresa Coleman  Procedure(s) Performed: Procedure(s) (LRB): CESAREAN SECTION WITH BILATERAL TUBAL LIGATION (Bilateral)  Patient location during evaluation: PACU Anesthesia Type: Spinal Level of consciousness: oriented and awake and alert Pain management: pain level controlled Vital Signs Assessment: post-procedure vital signs reviewed and stable Respiratory status: spontaneous breathing, respiratory function stable and nonlabored ventilation Cardiovascular status: blood pressure returned to baseline and stable Postop Assessment: no headache, no backache, no signs of nausea or vomiting, patient able to bend at knees and spinal receding Anesthetic complications: no    Last Vitals:  Filed Vitals:   09/25/15 1145 09/25/15 1200  BP: 115/76 112/81  Pulse: 79 72  Temp:  37 C  Resp: 17 10    Last Pain:  Filed Vitals:   09/25/15 1215  PainSc: 3                  Melessia Kaus A.

## 2015-09-26 ENCOUNTER — Encounter (HOSPITAL_COMMUNITY): Payer: Self-pay | Admitting: Obstetrics and Gynecology

## 2015-09-26 LAB — CBC
HEMATOCRIT: 26.9 % — AB (ref 36.0–46.0)
Hemoglobin: 9 g/dL — ABNORMAL LOW (ref 12.0–15.0)
MCH: 30 pg (ref 26.0–34.0)
MCHC: 33.5 g/dL (ref 30.0–36.0)
MCV: 89.7 fL (ref 78.0–100.0)
PLATELETS: 137 10*3/uL — AB (ref 150–400)
RBC: 3 MIL/uL — ABNORMAL LOW (ref 3.87–5.11)
RDW: 13.6 % (ref 11.5–15.5)
WBC: 10.7 10*3/uL — ABNORMAL HIGH (ref 4.0–10.5)

## 2015-09-26 LAB — BIRTH TISSUE RECOVERY COLLECTION (PLACENTA DONATION)

## 2015-09-26 MED ORDER — LACTATED RINGERS IV BOLUS (SEPSIS)
1000.0000 mL | Freq: Once | INTRAVENOUS | Status: AC
  Administered 2015-09-26: 1000 mL via INTRAVENOUS

## 2015-09-26 NOTE — Clinical Social Work Maternal (Signed)
CLINICAL SOCIAL WORK MATERNAL/CHILD NOTE  Patient Details  Name: Theresa Coleman MRN: [id number] Date of Birth: 23-Nov-1980  Date:  09/26/2015  Clinical Social Worker Initiating Note:  Theresa Coleman MSW, LCSW Date/ Time Initiated:  09/26/15/0840     Child's Name:  Theresa Coleman   Legal Guardian: Theresa Coleman and Theresa Coleman Theresa Coleman  Need for Interpreter:  None   Date of Referral:  09/25/15     Reason for Referral:  History of depression  Referral Source:  NICU   Address:  [address] [place], [place] [address]  Phone number:  [phone]   Household Members:  Minor Children, Spouse   Natural Supports (not living in the home):  Immediate Family (on same street as MOB, Extended Family, Friends   Herbalistrofessional Supports: None   Employment: Environmental education officerull-time   Type of Work: At [employer]   Education:    N/A  Surveyor, quantityinancial Resources:  Media plannerrivate Insurance   Other Resources:    None identified   Cultural/Religious Considerations Which May Impact Care:  None reported  Strengths:  Ability to meet basic needs , Home prepared for child , Pediatrician chosen    Risk Factors/Current Problems:  None   Cognitive State:  Able to Concentrate , Alert , Goal Oriented , Linear Thinking    Mood/Affect:  Happy , Animated, Bright    CSW Assessment:  CSW received request for consult due to MOB presenting with a history of depression.  MOB presented in a pleasant mood, was easily engaged, and receptive to the visit. She displayed a full range in affect, and was observed to be smiling as she held and cared for the infant.    MOB endorsed feelings of happiness surrounding her childbirth experience and transition postpartum.  MOB stated that her hopes for the childbirth experience had been fulfilled, and reported that she was happy that she was able to immediately provide skin-to-skin and initiate breastfeeding with the infant after his birth. MOB endorsed feeling well supported at the hospital, and  shared that she is looking forward to transitioning home.  MOB reported that she has strong family and friend support, and shared that she is receptive to asking for and accepting help from her support system. MOB shared that she recognizes the importance of caring for herself and engaging in self-care.  MOB stated that her 34 year old son is also excited to become a big brother.  Per MOB, the home is prepared for the infant, and she is looking forward to the upcoming holidays.  MOB endorsed presence of strong support from her employer, [employer]  She stated that she intends to take 8 weeks of FMLA, and shared belief that her employer is family-focused.   MOB reported history of depression 3-4 years ago. She shared that it was situational to work, and reported that she previously worked in an unsupportive work unit.  MOB stated that she felt overwhelmed and stressed while at work, and shared that she realized that it was time to make a change since she did not like how she felt.  MOB reported that she has since changed work units/assigments and has noted no additional symptoms of depression. MOB denied history of postpartum depression, and denied mental health complications during the pregnancy.  CSW provided education on perinatal mood and anxiety disorders including common signs, symptoms, and commonality of symptoms. MOB acknowledged that symptoms can be treated if they occur, and agreed to contact and follow up with her medical provider if she notes onset of  symptoms.  MOB denied additional questions, concerns, or needs at this time. She expressed appreciation for the visit, acknowledged ongoing CSW availability, and agreed to contact CSW if needs arise.   CSW Plan/Description:   1. Patient/Family Education: Perinatal mood and anxiety disorders   2. No Further Intervention Required/No Barriers to Discharge    [name], LCSW 09/26/2015, 10:02 AM

## 2015-09-26 NOTE — Lactation Note (Signed)
This note was copied from the chart of Theresa Coleman. Lactation Consultation Note  Patient Name: Theresa Raelyn Ensignbigayle Melin WUJWJ'XToday's Date: 09/26/2015 Reason for consult: Follow-up assessment RN requested help with latch. Mom was having trouble getting baby on the L breast, he goes easily to the R. After changing positions mom was able to latch baby unassisted. She denies breast and nipple pain. Baby was reported to have a frenulum issue but mom is not having any issues so no oral assessment was done at this time. Mom is aware of OP services and support group.    Maternal Data    Feeding Feeding Type: Breast Fed Length of feed: 20 min  LATCH Score/Interventions Latch: Repeated attempts needed to sustain latch, nipple held in mouth throughout feeding, stimulation needed to elicit sucking reflex. Intervention(s): Adjust position  Audible Swallowing: Spontaneous and intermittent Intervention(s): Skin to skin;Hand expression  Type of Nipple: Everted at rest and after stimulation  Comfort (Breast/Nipple): Soft / non-tender     Hold (Positioning): Assistance needed to correctly position infant at breast and maintain latch. Intervention(s): Position options;Support Pillows  LATCH Score: 8  Lactation Tools Discussed/Used     Consult Status Consult Status: Follow-up Date: 09/27/15 Follow-up type: In-patient    Rulon Eisenmengerlizabeth E Ormond Lazo 09/26/2015, 6:26 PM

## 2015-09-26 NOTE — Progress Notes (Signed)
Post Partum Day 1  Subjective:  Theresa Coleman is a 34 y.o. G2P2002 379w0d s/p rLTCS.  No acute events overnight.  Pt denies problems with ambulating, po intake. Patient has not voided yet.  She denies nausea or vomiting.  Pain is well controlled.  She has had flatus. She has not had bowel movement.  Lochia Minimal.  Plan for birth control is bilateral tubal ligation.  Method of Feeding: Breast  Objective: BP 102/66 mmHg  Pulse 73  Temp(Src) 97.8 F (36.6 C) (Oral)  Resp 18  Ht 5\' 2"  (1.575 m)  Wt 185 lb (83.915 kg)  BMI 33.83 kg/m2  SpO2 95%  LMP 12/26/2014  Breastfeeding? Unknown  Physical Exam:  General: alert, cooperative and no distress Lochia:normal flow Chest: CTAB Heart: RRR no m/r/g Abdomen: +BS, soft, nontender, fundus firm at/below umbilicus Uterine Fundus: firm,  DVT Evaluation: No evidence of DVT seen on physical exam.  Recent Labs  09/25/15 0758 09/26/15 0607  HGB 10.8* 9.0*  HCT 33.1* 26.9*    Assessment/Plan:  ASSESSMENT: Theresa Coleman is a 34 y.o. G2P2002 879w0d ppd #1 s/p rLTCS doing well.   Plan for discharge tomorrow   LOS: 1 day   Mikhai Bienvenue Z Kamilia Carollo 09/26/2015, 7:15 AM

## 2015-09-27 DIAGNOSIS — Z9851 Tubal ligation status: Secondary | ICD-10-CM

## 2015-09-27 DIAGNOSIS — Z98891 History of uterine scar from previous surgery: Secondary | ICD-10-CM

## 2015-09-27 MED ORDER — OXYCODONE-ACETAMINOPHEN 5-325 MG PO TABS: 1.0000 | ORAL_TABLET | ORAL | Status: DC | PRN

## 2015-09-27 MED ORDER — IBUPROFEN 600 MG PO TABS: 600.0000 mg | ORAL_TABLET | Freq: Four times a day (QID) | ORAL | Status: DC

## 2015-09-27 NOTE — Discharge Summary (Signed)
OB Discharge Summary     Patient Name: Theresa Coleman DOB: Mar 29, 1981 MRN: [id number]  Date of admission: 09/25/2015 Delivering MD: Catalina AntiguaONSTANT, PEGGY   Date of discharge: 09/27/2015  Admitting diagnosis: cpt 59514 - REPEAT cs and undersired fertility Intrauterine pregnancy: 4180w0d     Secondary diagnosis:  Principal Problem:   S/P repeat low transverse C-section Active Problems:   Supervision of other normal pregnancy, antepartum   Previous cesarean delivery, antepartum   Clinical depression   Proteinuria affecting pregnancy   Status post repeat low transverse cesarean section   Status post tubal ligation  Additional problems: none     Discharge diagnosis: Term Pregnancy Delivered                                                                                                Post partum procedures:None  Augmentation: NA  Complications: None  Hospital course:  Sceduled C/S   34 y.o. yo G2P2002 at 3580w0d was admitted to the hospital 09/25/2015 for scheduled cesarean section with the following indication:Elective Repeat.  Membrane Rupture Time/Date: 9:51 AM ,09/25/2015   Patient delivered a Viable infant.09/25/2015  Details of operation can be found in separate operative note.  Pateint had an uncomplicated postpartum course.  She is ambulating, tolerating a regular diet, passing flatus, and urinating well. Patient is discharged home in stable condition on 09/27/2015         Physical exam  Filed Vitals:   09/26/15 0534 09/26/15 1017 09/26/15 1859 09/27/15 0635  BP: 102/66 117/79 124/74 124/81  Pulse: 73 90 81 92  Temp:  97.7 F (36.5 C) 98 F (36.7 C) 98 F (36.7 C)  TempSrc:  Oral Oral Oral  Resp: 18 18 18 18   Height:      Weight:      SpO2: 95% 96%     General: alert, cooperative and no distress Lochia: appropriate Uterine Fundus: firm Incision: Healing well with no significant drainage, No significant erythema, Dressing is clean, dry, and intact DVT  Evaluation: No evidence of DVT seen on physical exam. Negative Homan's sign. Labs: Lab Results  Component Value Date   WBC 10.7* 09/26/2015   HGB 9.0* 09/26/2015   HCT 26.9* 09/26/2015   MCV 89.7 09/26/2015   PLT 137* 09/26/2015   CMP Latest Ref Rng 09/20/2015  Glucose 65 - 99 mg/dL 409(W103(H)  BUN 6 - 20 mg/dL 11  Creatinine 1.190.44 - 1.471.00 mg/dL 8.290.66  Sodium 562135 - 130145 mmol/L 134(L)  Potassium 3.5 - 5.1 mmol/L 3.5  Chloride 101 - 111 mmol/L 103  CO2 22 - 32 mmol/L 23  Calcium 8.9 - 10.3 mg/dL 8.6(V8.6(L)  Total Protein 6.5 - 8.1 g/dL 7.8(I5.7(L)  Total Bilirubin 0.3 - 1.2 mg/dL 0.7  Alkaline Phos 38 - 126 U/L 190(H)  AST 15 - 41 U/L 21  ALT 14 - 54 U/L 18    Discharge instruction: per After Visit Summary and "Baby and Me Booklet".  After visit meds:    Medication List    TAKE these medications        ibuprofen 600 MG tablet  Commonly  known as:  ADVIL,MOTRIN  Take 1 tablet (600 mg total) by mouth every 6 (six) hours.     oxyCODONE-acetaminophen 5-325 MG tablet  Commonly known as:  PERCOCET/ROXICET  Take 1 tablet by mouth every 4 (four) hours as needed (for pain scale 4-7).     PRENATAL VITAMIN PO  Take by mouth.        Diet: routine diet  Activity: Advance as tolerated. Pelvic rest for 6 weeks.   Outpatient follow up:6 weeks for postpartum appt, 2 weeks for wound check Follow up Appt: Future Appointments Date Time Provider Department Center  10/09/2015 1:45 PM Reva Bores, MD [facility]  11/06/2015 1:15 PM Reva Bores, MD [facility]   Follow up Visit:No Follow-up on file.  Postpartum contraception: Tubal Ligation- performed at time of CS  Newborn Data: Live born female  Birth Weight: 7 lb 8.1 oz (3405 g) APGAR: 8, 9  Baby Feeding: Breast Disposition:home with mother   09/27/2015 Federico Flake, MD

## 2015-09-27 NOTE — Progress Notes (Signed)
Discussed safe sleep, car seat, infant crying and warning signs for mom and baby. Referred to Taking [facility] of Baby [facility] Me.  

## 2015-09-27 NOTE — Discharge Instructions (Signed)

## 2015-10-09 ENCOUNTER — Encounter: Payer: Self-pay | Admitting: Family Medicine

## 2015-10-09 ENCOUNTER — Ambulatory Visit (INDEPENDENT_AMBULATORY_CARE_PROVIDER_SITE_OTHER): Payer: Managed Care, Other (non HMO) | Admitting: Family Medicine

## 2015-10-09 VITALS — BP 139/85 | HR 59 | Resp 20 | Ht 62.0 in | Wt 157.0 lb

## 2015-10-09 DIAGNOSIS — Z09 Encounter for follow-up examination after completed treatment for conditions other than malignant neoplasm: Secondary | ICD-10-CM

## 2015-10-09 NOTE — Progress Notes (Signed)
    Subjective:    Patient ID: Theresa Coleman is a 35 y.o. female presenting with Wound Check  on 10/09/2015  HPI: Here for post op check.  S/p C-section 2 wks ago.  Honeycomb dressing remains in place. Denies issues with mood. Has recent URI and is seeing PCP for meds.  Review of Systems  Constitutional: Negative for fever and chills.  Respiratory: Negative for shortness of breath.   Cardiovascular: Negative for chest pain.  Gastrointestinal: Negative for nausea, vomiting and abdominal pain.  Genitourinary: Negative for dysuria.  Skin: Negative for rash.      Objective:    BP 139/85 mmHg  Pulse 59  Resp 20  Ht 5\' 2"  (1.575 m)  Wt 157 lb (71.215 kg)  BMI 28.71 kg/m2  Breastfeeding? No Physical Exam  Constitutional: She is oriented to person, place, and time. She appears well-developed and well-nourished. No distress.  HENT:  Head: Normocephalic and atraumatic.  Eyes: No scleral icterus.  Neck: Neck supple.  Cardiovascular: Normal rate.   Pulmonary/Chest: Effort normal.  Abdominal: Soft.  Genitourinary:  Uterus is involuting and is 3-4 fingerbreadths below umbilicus and non-tender  Neurological: She is alert and oriented to person, place, and time.  Skin: Skin is warm and dry. No rash noted. No erythema.  Incision is well approximated and intact  Psychiatric: She has a normal mood and affect.      Assessment & Plan:  Postop check Doing well--no issues Return in about 4 weeks (around 11/06/2015) for pp check.    Shawnta Schlegel S 10/09/2015 2:11 PM

## 2015-10-09 NOTE — Progress Notes (Signed)
Pt here today for incision check.  Declines pain, redness, or fever.  Honeycomb and steri strips still in place on assessment, both removed and site cleaned.  Incision well approximated with no signs of infection.  Reviewed incision care with patient, acknowledged instructions.

## 2015-10-23 ENCOUNTER — Encounter: Payer: Self-pay | Admitting: Obstetrics and Gynecology

## 2015-10-23 ENCOUNTER — Ambulatory Visit (INDEPENDENT_AMBULATORY_CARE_PROVIDER_SITE_OTHER): Payer: Managed Care, Other (non HMO) | Admitting: Obstetrics and Gynecology

## 2015-10-23 NOTE — Progress Notes (Signed)
  Subjective:     Theresa Coleman is a 35 y.o. female who presents for a postpartum visit. She is 4 weeks postpartum following a low cervical transverse Cesarean section. I have fully reviewed the prenatal and intrapartum course. The delivery was at 39 gestational weeks. Outcome: repeat cesarean section, low transverse incision. Anesthesia: spinal. Postpartum course has been uncomplicated. Baby's course has been uncomplicated. Baby is feeding by bottle - Similac Advance. Bleeding no bleeding. Bowel function is normal. Bladder function is normal. Patient is not sexually active. Contraception method is tubal ligation. Postpartum depression screening: negative.     Review of Systems Pertinent items are noted in HPI.   Objective:    BP 103/66 mmHg  Pulse 76  Wt 158 lb (71.668 kg)  Breastfeeding? No  General:  alert, cooperative and no distress   Breasts:  inspection negative, no nipple discharge or bleeding, no masses or nodularity palpable  Lungs: clear to auscultation bilaterally  Heart:  regular rate and rhythm  Abdomen: soft, non-tender; bowel sounds normal; no masses,  no organomegaly and incision: healed. No erythema, induration or drainage   Vulva:  normal  Vagina: normal vagina, no discharge, exudate, lesion, or erythema  Cervix:  no cervical motion tenderness  Corpus: normal size, contour, position, consistency, mobility, non-tender  Adnexa:  no mass, fullness, tenderness  Rectal Exam: Not performed.        Assessment:     Normal postpartum exam. Pap smear not done at today's visit.   Plan:    1. Contraception: tubal ligation 2. Patient is medically cleared to resume all activities of daily living 3. Follow up in: a few months or as needed.

## 2015-11-06 ENCOUNTER — Ambulatory Visit: Payer: Commercial Indemnity | Admitting: Family Medicine
